# Patient Record
Sex: Male | Born: 1980
Health system: Southern US, Community
[De-identification: ages and names within clinical notes are randomized; demographics above are authoritative.]

---
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
## 2005-09-27 ENCOUNTER — Emergency Department (HOSPITAL_COMMUNITY): Admission: EM | Admit: 2005-09-27 | Discharge: 2005-09-27 | Payer: Self-pay | Admitting: Family Medicine

## 2006-06-13 ENCOUNTER — Emergency Department (HOSPITAL_COMMUNITY): Admission: EM | Admit: 2006-06-13 | Discharge: 2006-06-13 | Payer: Self-pay | Admitting: Emergency Medicine

## 2006-08-06 ENCOUNTER — Emergency Department (HOSPITAL_COMMUNITY): Admission: EM | Admit: 2006-08-06 | Discharge: 2006-08-06 | Payer: Self-pay | Admitting: Family Medicine

## 2010-01-02 ENCOUNTER — Emergency Department (HOSPITAL_BASED_OUTPATIENT_CLINIC_OR_DEPARTMENT_OTHER): Admission: EM | Admit: 2010-01-02 | Discharge: 2010-01-03 | Payer: Self-pay | Admitting: Emergency Medicine

## 2010-03-28 ENCOUNTER — Ambulatory Visit: Payer: Self-pay | Admitting: Radiology

## 2010-03-28 ENCOUNTER — Emergency Department (HOSPITAL_BASED_OUTPATIENT_CLINIC_OR_DEPARTMENT_OTHER): Admission: EM | Admit: 2010-03-28 | Discharge: 2010-03-28 | Payer: Self-pay | Admitting: Emergency Medicine

## 2010-04-01 ENCOUNTER — Emergency Department (HOSPITAL_BASED_OUTPATIENT_CLINIC_OR_DEPARTMENT_OTHER): Admission: EM | Admit: 2010-04-01 | Discharge: 2010-04-01 | Payer: Self-pay | Admitting: Emergency Medicine

## 2010-04-01 ENCOUNTER — Ambulatory Visit: Payer: Self-pay | Admitting: Diagnostic Radiology

## 2011-02-19 ENCOUNTER — Emergency Department (HOSPITAL_BASED_OUTPATIENT_CLINIC_OR_DEPARTMENT_OTHER)
Admission: EM | Admit: 2011-02-19 | Discharge: 2011-02-19 | Disposition: A | Discharge status: Home / Self Care | Payer: Self-pay | Attending: Emergency Medicine | Admitting: Emergency Medicine

## 2011-02-19 DIAGNOSIS — L255 Unspecified contact dermatitis due to plants, except food: Secondary | ICD-10-CM | POA: Insufficient documentation

## 2011-02-19 DIAGNOSIS — T622X1A Toxic effect of other ingested (parts of) plant(s), accidental (unintentional), initial encounter: Secondary | ICD-10-CM | POA: Insufficient documentation

## 2012-04-09 ENCOUNTER — Encounter (HOSPITAL_BASED_OUTPATIENT_CLINIC_OR_DEPARTMENT_OTHER): Payer: Self-pay | Admitting: *Deleted

## 2012-04-09 ENCOUNTER — Emergency Department (HOSPITAL_BASED_OUTPATIENT_CLINIC_OR_DEPARTMENT_OTHER)
Admission: EM | Admit: 2012-04-09 | Discharge: 2012-04-10 | Disposition: A | Discharge status: Home / Self Care | Payer: Self-pay | Attending: Emergency Medicine | Admitting: Emergency Medicine

## 2012-04-09 DIAGNOSIS — R509 Fever, unspecified: Secondary | ICD-10-CM | POA: Insufficient documentation

## 2012-04-09 MED ORDER — ACETAMINOPHEN 500 MG PO TABS
1000.0000 mg | ORAL_TABLET | Freq: Once | ORAL | Status: AC
  Administered 2012-04-09: 1000 mg via ORAL
  Filled 2012-04-09: qty 2

## 2012-04-09 NOTE — ED Notes (Signed)
Pt reports chills, body aches x43min. Pt anxious upon arrival to ER. Coached on relaxation techniques.

## 2012-04-10 ENCOUNTER — Emergency Department (HOSPITAL_BASED_OUTPATIENT_CLINIC_OR_DEPARTMENT_OTHER): Payer: Self-pay

## 2012-04-10 LAB — BASIC METABOLIC PANEL
Calcium: 9.4 mg/dL (ref 8.4–10.5)
Chloride: 100 mEq/L (ref 96–112)
Creatinine, Ser: 1 mg/dL (ref 0.50–1.35)
GFR calc Af Amer: >90 mL/min (ref >90)
Glucose, Bld: 106 mg/dL — ABNORMAL HIGH (ref 70–99)
Potassium: 3.7 mEq/L (ref 3.5–5.1)
Sodium: 137 mEq/L (ref 135–145)

## 2012-04-10 LAB — CBC WITH DIFFERENTIAL/PLATELET
Basophils Absolute: 0 10*3/uL (ref 0.0–0.1)
Eosinophils Absolute: 0.1 10*3/uL (ref 0.0–0.7)
Lymphocytes Relative: 10 % — ABNORMAL LOW (ref 12–46)
Lymphs Abs: 1 10*3/uL (ref 0.7–4.0)
MCH: 31 pg (ref 26.0–34.0)
MCV: 87.1 fL (ref 78.0–100.0)
Monocytes Absolute: 0.8 10*3/uL (ref 0.1–1.0)
Monocytes Relative: 7 % (ref 3–12)
Neutrophils Relative %: 83 % — ABNORMAL HIGH (ref 43–77)
RBC: 4.58 MIL/uL (ref 4.22–5.81)
WBC: 10.9 10*3/uL — ABNORMAL HIGH (ref 4.0–10.5)

## 2012-04-10 LAB — URINALYSIS, ROUTINE W REFLEX MICROSCOPIC
Hgb urine dipstick: NEGATIVE
Ketones, ur: NEGATIVE mg/dL
Leukocytes, UA: NEGATIVE
Nitrite: NEGATIVE
pH: 7.5 (ref 5.0–8.0)

## 2012-04-10 NOTE — ED Notes (Signed)
Pt reports eating at [employer] earlier in the evening, developed sudden onset of chills, malaise, denies, n/v, abdominal pain, sob or chest pain.

## 2012-04-10 NOTE — ED Provider Notes (Signed)
History     CSN: [id number]  Arrival date & time 04/09/12  2140   First MD Initiated Contact with Patient 04/10/12 0111      Chief Complaint  Patient presents with  . Chills    (Consider location/radiation/quality/duration/timing/severity/associated sxs/prior treatment) HPI Pt reports he was in his normal state of health at work just prior to arrival when he had a brief episode of L side pain followed by feeling cold and having shakes all over. He drove himself to the ED where he was found to have fever. He denies any URI symptoms, cough, chest pain, SOB, nausea vomiting, diarrhea, dysuria. He no longer has flank pain. He has a mild headache but no neck stiffness. No rashes or tick bites. No known sick contacts.   History reviewed. No pertinent past medical history.  History reviewed. No pertinent past surgical history.  No family history on file.  History  Substance Use Topics  . Smoking status: Never Smoker   . Smokeless tobacco: Not on file  . Alcohol Use: No      Review of Systems All other systems reviewed and are negative except as noted in HPI.   Allergies  Review of patient's allergies indicates no known allergies.  Home Medications   Current Outpatient Rx  Name Route Sig Dispense Refill  . HYDROCORTISONE 2.5 % EX CREA Topical Apply 1 application topically daily.    . ADULT MULTIVITAMIN W/MINERALS CH Oral Take 1 tablet by mouth daily.      BP 122/69  Pulse 120  Temp 99.5 F (37.5 C) (Oral)  Resp 22  Ht 6\' 1"  (1.854 m)  Wt 140 lb (63.504 kg)  BMI 18.47 kg/m2  SpO2 97%  Physical Exam  Nursing note and vitals reviewed. Constitutional: He is oriented to person, place, and time. He appears well-developed and well-nourished.  HENT:  Head: Normocephalic and atraumatic.  Eyes: EOM are normal. Pupils are equal, round, and reactive to light.  Neck: Normal range of motion. Neck supple.       No meningismus  Cardiovascular: Normal rate, normal heart sounds  and intact distal pulses.   Pulmonary/Chest: Effort normal and breath sounds normal.  Abdominal: Bowel sounds are normal. He exhibits no distension. There is no tenderness.  Musculoskeletal: Normal range of motion. He exhibits no edema and no tenderness.  Neurological: He is alert and oriented to person, place, and time. He has normal strength. No cranial nerve deficit or sensory deficit.  Skin: Skin is warm and dry. No rash noted.  Psychiatric: He has a normal mood and affect.    ED Course  Procedures (including critical care time)  Labs Reviewed  CBC WITH DIFFERENTIAL - Abnormal; Notable for the following:    WBC 10.9 (*)     Neutrophils Relative 83 (*)     Neutro Abs 9.0 (*)     Lymphocytes Relative 10 (*)     All other components within normal limits  BASIC METABOLIC PANEL - Abnormal; Notable for the following:    Glucose, Bld 106 (*)     All other components within normal limits  URINALYSIS, ROUTINE W REFLEX MICROSCOPIC   Dg Chest 2 View  04/10/2012  *RADIOLOGY REPORT*  Clinical Data: Sudden onset of fever, chills, body aches and headache.  CHEST - 2 VIEW  Comparison: None.  Findings: The lungs are well-aerated and clear.  There is no evidence of focal opacification, pleural effusion or pneumothorax.  The heart is normal in size; the mediastinal contour  is within normal limits.  No acute osseous abnormalities are seen.  IMPRESSION: No acute cardiopulmonary process seen.  Original Report Authenticated By: Tonia Ghent, M.D.     No diagnosis found.    MDM  Pt febrile but improved with APAP. No source for infection found in the ED. Discussed HIV testing with patient, states he was tested about a year ago but consents to HIV antibody screen tonight. Advised he would be contacted if it was positive. He is feeling better and ready to go home.         Ilana Prezioso B. Bernette Mayers, MD 04/10/12 [phone]

## 2013-12-24 DIAGNOSIS — R51 Headache: Secondary | ICD-10-CM | POA: Insufficient documentation

## 2013-12-24 DIAGNOSIS — R1013 Epigastric pain: Secondary | ICD-10-CM | POA: Insufficient documentation

## 2013-12-24 DIAGNOSIS — M549 Dorsalgia, unspecified: Secondary | ICD-10-CM | POA: Insufficient documentation

## 2013-12-24 DIAGNOSIS — IMO0002 Reserved for concepts with insufficient information to code with codable children: Secondary | ICD-10-CM | POA: Insufficient documentation

## 2013-12-25 ENCOUNTER — Emergency Department (HOSPITAL_BASED_OUTPATIENT_CLINIC_OR_DEPARTMENT_OTHER)
Admission: EM | Admit: 2013-12-25 | Discharge: 2013-12-25 | Disposition: A | Discharge status: Home / Self Care | Payer: Self-pay | Attending: Emergency Medicine | Admitting: Emergency Medicine

## 2013-12-25 ENCOUNTER — Encounter (HOSPITAL_BASED_OUTPATIENT_CLINIC_OR_DEPARTMENT_OTHER): Payer: Self-pay | Admitting: Emergency Medicine

## 2013-12-25 DIAGNOSIS — R109 Unspecified abdominal pain: Secondary | ICD-10-CM

## 2013-12-25 DIAGNOSIS — M549 Dorsalgia, unspecified: Secondary | ICD-10-CM

## 2013-12-25 LAB — CBC WITH DIFFERENTIAL/PLATELET
BASOS ABS: 0 10*3/uL (ref 0.0–0.1)
BASOS PCT: 0 % (ref 0–1)
EOS PCT: 1 % (ref 0–5)
Eosinophils Absolute: 0.1 10*3/uL (ref 0.0–0.7)
HEMATOCRIT: 42.9 % (ref 39.0–52.0)
Hemoglobin: 15.3 g/dL (ref 13.0–17.0)
Lymphocytes Relative: 26 % (ref 12–46)
Lymphs Abs: 1.9 10*3/uL (ref 0.7–4.0)
MCH: 31.6 pg (ref 26.0–34.0)
MCHC: 35.7 g/dL (ref 30.0–36.0)
MCV: 88.6 fL (ref 78.0–100.0)
MONO ABS: 0.7 10*3/uL (ref 0.1–1.0)
MONOS PCT: 10 % (ref 3–12)
NEUTROS PCT: 62 % (ref 43–77)
Neutro Abs: 4.4 10*3/uL (ref 1.7–7.7)
Platelets: 191 10*3/uL (ref 150–400)
RBC: 4.84 MIL/uL (ref 4.22–5.81)
RDW: 12.4 % (ref 11.5–15.5)
WBC: 7.1 10*3/uL (ref 4.0–10.5)

## 2013-12-25 LAB — COMPREHENSIVE METABOLIC PANEL
ALBUMIN: 4.3 g/dL (ref 3.5–5.2)
ALT: 15 U/L (ref 0–53)
AST: 18 U/L (ref 0–37)
Alkaline Phosphatase: 66 U/L (ref 39–117)
BUN: 16 mg/dL (ref 6–23)
CALCIUM: 9.7 mg/dL (ref 8.4–10.5)
CO2: 31 meq/L (ref 19–32)
CREATININE: 1.1 mg/dL (ref 0.50–1.35)
Chloride: 99 mEq/L (ref 96–112)
GFR, EST NON AFRICAN AMERICAN: 88 mL/min — AB (ref >90)
Glucose, Bld: 93 mg/dL (ref 70–99)
POTASSIUM: 3.8 meq/L (ref 3.7–5.3)
Sodium: 140 mEq/L (ref 137–147)
Total Bilirubin: 0.7 mg/dL (ref 0.3–1.2)
Total Protein: 7.5 g/dL (ref 6.0–8.3)

## 2013-12-25 LAB — URINALYSIS, ROUTINE W REFLEX MICROSCOPIC
Bilirubin Urine: NEGATIVE
GLUCOSE, UA: NEGATIVE mg/dL
HGB URINE DIPSTICK: NEGATIVE
Ketones, ur: 15 mg/dL — AB
Leukocytes, UA: NEGATIVE
NITRITE: NEGATIVE
PH: 5 (ref 5.0–8.0)
PROTEIN: NEGATIVE mg/dL
SPECIFIC GRAVITY, URINE: 1.031 — AB (ref 1.005–1.030)
Urobilinogen, UA: 1 mg/dL (ref 0.0–1.0)

## 2013-12-25 LAB — LIPASE, BLOOD: LIPASE: 18 U/L (ref 11–59)

## 2013-12-25 MED ORDER — IBUPROFEN 600 MG PO TABS: 600.0000 mg | ORAL_TABLET | Freq: Four times a day (QID) | ORAL | Status: DC | PRN

## 2013-12-25 NOTE — ED Provider Notes (Signed)
CSN: [id number]     Arrival date & time 12/24/13  2335 History   First MD Initiated Contact with Patient 12/25/13 [phone]     Chief Complaint  Patient presents with  . Abdominal Pain  . Back Pain     (Consider location/radiation/quality/duration/timing/severity/associated sxs/prior Treatment) HPI Comments: Pt comes in with cc of headaches, abd pain and back pain. All hi [place] started on Wednesday. His abd pain is epigastric and right sided, constant, with no specific aggravating or relieving factors. Pain is moving towards the back, with no uti like sx. No nausea, emesis. He is also having a mild headache. No urti like sx, no hx of renal stones, no abd surgery.  Patient is a 33 y.o. male presenting with abdominal pain and back pain. The history is provided by the patient.  Abdominal Pain Associated symptoms: no chest pain, no cough, no dysuria and no shortness of breath   Back Pain Associated symptoms: abdominal pain and headaches   Associated symptoms: no chest pain and no dysuria     History reviewed. No pertinent past medical history. History reviewed. No pertinent past surgical history. No family history on file. History  Substance Use Topics  . Smoking status: Never Smoker   . Smokeless tobacco: Not on file  . Alcohol Use: No    Review of Systems  Constitutional: Negative for activity change and appetite change.  Respiratory: Negative for cough and shortness of breath.   Cardiovascular: Negative for chest pain.  Gastrointestinal: Positive for abdominal pain.  Genitourinary: Positive for flank pain. Negative for dysuria, frequency, scrotal swelling and testicular pain.  Musculoskeletal: Positive for back pain.  Neurological: Positive for headaches.      Allergies  Review of patient's allergies indicates no known allergies.  Home Medications   Current Outpatient Rx  Name  Route  Sig  Dispense  Refill  . hydrocortisone 2.5 % cream   Topical   Apply 1 application  topically daily.         Marland Kitchen. ibuprofen (ADVIL,MOTRIN) 600 MG tablet   Oral   Take 1 tablet (600 mg total) by mouth every 6 (six) hours as needed.   30 tablet   0   . Multiple Vitamin (MULTIVITAMIN WITH MINERALS) TABS   Oral   Take 1 tablet by mouth daily.          BP 116/75  Pulse 85  Temp(Src) 98 F (36.7 C) (Oral)  Resp 16  Ht 6' (1.829 m)  Wt 145 lb (65.772 kg)  BMI 19.66 kg/m2  SpO2 100% Physical Exam  Nursing note and vitals reviewed. Constitutional: He is oriented to person, place, and time. He appears well-developed.  HENT:  Head: Normocephalic and atraumatic.  Eyes: Conjunctivae and EOM are normal. Pupils are equal, round, and reactive to light.  Neck: Normal range of motion. Neck supple.  Cardiovascular: Normal rate and regular rhythm.   Pulmonary/Chest: Effort normal and breath sounds normal.  Abdominal: Soft. Bowel sounds are normal. He exhibits no distension. There is no tenderness. There is no rebound and no guarding.  Neurological: He is alert and oriented to person, place, and time.  Skin: Skin is warm.    ED Course  Procedures (including critical care time) Labs Review Labs Reviewed  URINALYSIS, ROUTINE W REFLEX MICROSCOPIC - Abnormal; Notable for the following:    Specific Gravity, Urine 1.031 (*)    Ketones, ur 15 (*)    All other components within normal limits  COMPREHENSIVE METABOLIC PANEL -  Abnormal; Notable for the following:    GFR calc non Af Amer 88 (*)    All other components within normal limits  CBC WITH DIFFERENTIAL  LIPASE, BLOOD   Imaging Review No results found.   EKG Interpretation None      MDM   Final diagnoses:  Abdominal pain  Back pain    Pt comes in with cc of abd pain, headache, back pain. Back pain is not over the spine. Abd exam x 2  -unchanged, no peritoneal signs, and the tenderness is periumbilical. GI labs are WNL.  Return precautions for early appy discussed wit the patient, but suspect that this is  possible mild gastro.    Derwood Kaplan, MD 12/25/13 [phone]

## 2013-12-25 NOTE — Discharge Instructions (Signed)
We saw you in the ER for the abdominal pain. All of our results are normal. We are not sure what is causing your abdominal pain, and recommend that you see your primary care doctor for further evaluation. If your symptoms get worse, return to the ER. Take the pain meds and nausea meds as prescribed.  RESOURCE GUIDE  Chronic Pain Problems: Contact [facility]  [phone] Patients need to be referred by their primary care doctor.  Insufficient Money for Medicine: Contact [employer]:  call "211."   No Primary Care Doctor: - Call Health Connect  [phone] - can help you locate a primary care doctor that  accepts your insurance, provides certain services, etc. - Physician Referral Service- [phone]  Agencies that provide inexpensive medical care: - [facility]  [phone] - [facility]  [phone] - [facility]  [phone] - Women's Clinic  [phone] - [facility]  [phone] [facility]  [phone]  Medicaid-accepting [facility] Providers: - [facility]- [address]  [phone], Mon-Fri 9am-7pm, Sat 9am-1pm - [facility]- [address] [place], [address] [place]  [phone] - [facility]- [address] [place]  [phone] [facility]- [address]  [phone] - Renaye Rakers- [address] [place], [address], [phone]  Only accepts [place] patients after they have their name  applied to their card  Self Pay (no insurance) in [place]: - Sickle Cell Patients: Dr Willey Blade, [facility]  [address] [place], [phone] - [facility]- [address] [place]  [phone]       [facility] [place]- [address]       -     [facility]- see information above (Speak to [employer] if you do not have insurance)       -  [facility]- [address],  [phone]       -  [facility]- [address] [phone]       -  Dr Julio Sicks-  [address], [place], [phone]       -  [facility] - [address], [phone]       -  [facility]- [address], [phone], also [address] [phone]       -    [facility]- [address] [place], [phone], 1st & 3rd Saturday        every month, 10am-1pm  [facility] [address] [place], [place] [address] [phone]  [facility] [address] [place], [place] [address] [phone]  1) Find a Doctor and Pay Out of Pocket Although you won't have to find out who is covered by your insurance plan, it is a good idea to ask around and get recommendations. You will then need to call the office and see if the doctor you have chosen will accept you as a new patient and what types of options they offer for patients who are self-pay. Some doctors offer discounts or will set up payment plans for their patients who do not have insurance, but you will need to ask so you aren't surprised when you get to your appointment.  2) Contact Your Local Health Department Not all health departments have doctors that can see patients for sick visits, but many do, so it is worth a call to see if yours does. If you don't know where your local health department is, you can check in your phone book. The CDC also has a tool to help you locate your state's health department, and many state websites also have listings of all of their local health departments.  3) Find a Walk-in Clinic If your illness is not likely to be very severe or complicated, you may want to try a walk in clinic. These are popping up all over the country in pharmacies, drugstores, and shopping centers. They're usually staffed by nurse practitioners or physician assistants that have been trained to treat  common illnesses and complaints. They're usually fairly quick and inexpensive. However, if you have serious medical issues or chronic medical problems, these are probably not your best option  STD Testing - [facility] [place], STD Clinic, [address], [place], phone [phone] or [phone].  Monday - Friday, call for an appointment. [facility]- [place] [facility] [place], STD Clinic, [place], [place], phone [phone] or [phone].  Monday - Friday, call for an appointment.  Abuse/Neglect: [facility]- [place] Child Abuse Hotline [phone] [facility]- [place] Child Abuse Hotline 818-858-12374063769630 (After Hours)  Emergency Shelter:  [facility] [phone]  Maternity Homes: - [facility] [phone] - [facility] [phone]  MRSA Hotline #:   (605) 061-9448(901)639-4511  Dental Assistance If unable to pay or uninsured, contact:  [facility] to become qualified for the adult dental clinic.  Patients with Medicaid: [facility] [phone], [phone] [address], [phone]  If unable to pay, or uninsured, contact [facility] [phone] in [place], [phone] in [facility]) to become qualified for the adult dental clinic  [facility] [address] [place], [place] [address] [phone] [email or web address]  Other [facility] Services: - [facility]- [address], [place], [place], [phone], [phone], [address]. [phone], 2nd and 4th Thursday of the month at 6:30am.  10 clients each day by appointment, can sometimes see walk-in patients if someone does not show for an appointment. [facility]- [address], [place], [place], [phone], [phone] - [facility]- [address], [place], [place], [phone], [phone] [facility]- [place] [facility]  Department- [phone] [facility]- [place] [facility]- [phone] [facility]- [place] [phone]- [phone]          Abdominal Pain, Adult Many things can cause abdominal pain. Usually, abdominal pain is not caused by a disease and will improve without treatment. It can often be observed and treated at home. Your health care provider will do a physical exam and possibly order blood tests and X-rays to help determine the seriousness of your pain. However, in many cases, more time must pass before a clear cause of the pain can be found. Before that point, your health care provider may not know if you need more testing or further treatment. HOME CARE INSTRUCTIONS  Monitor your abdominal pain for any changes. The following actions may help to alleviate any discomfort you are experiencing:  Only take over-the-counter or prescription medicines as directed by your health care provider.  Do not take laxatives unless directed to do so by your  health care provider.  Try a clear liquid diet (broth, tea, or water) as directed by your health care provider. Slowly move to a bland diet as tolerated. SEEK MEDICAL CARE IF:  You have unexplained abdominal pain.  You have abdominal pain associated with nausea or diarrhea.  You have pain when you urinate or have a bowel movement.  You experience abdominal pain that wakes you in the night.  You have abdominal pain that is worsened or improved by eating food.  You have abdominal pain that is worsened with eating fatty foods. SEEK IMMEDIATE MEDICAL CARE IF:   Your pain does not go away within 2 hours.  You have a fever.  You keep throwing up (vomiting).  Your pain is felt only in portions of the abdomen, such as the right side or the left lower portion of the abdomen.  You pass bloody or black tarry stools. MAKE SURE YOU:  Understand these instructions.   Will watch your condition.   Will get help right away if you are not doing well or  get worse.  Document Released: 06/26/2005 Document Revised: 07/07/2013 Document Reviewed: 05/26/2013 [facility] Patient Information 2014 [place], [place].

## 2013-12-25 NOTE — ED Notes (Signed)
Pt c/o abd pain and ha onset 2 days ago w pain now radiating to rt flank  Denies urinary diff

## 2013-12-25 NOTE — ED Notes (Signed)
Pt. Reports abd. Pain lower abd.  And reports R side back pain also.  NO nausea, vomiting or diarrhea.  Pt. Reports pain 2/10.

## 2014-09-26 ENCOUNTER — Encounter (HOSPITAL_BASED_OUTPATIENT_CLINIC_OR_DEPARTMENT_OTHER): Payer: Self-pay

## 2014-09-26 ENCOUNTER — Emergency Department (HOSPITAL_BASED_OUTPATIENT_CLINIC_OR_DEPARTMENT_OTHER): Payer: Self-pay

## 2014-09-26 ENCOUNTER — Emergency Department (HOSPITAL_BASED_OUTPATIENT_CLINIC_OR_DEPARTMENT_OTHER)
Admission: EM | Admit: 2014-09-26 | Discharge: 2014-09-26 | Disposition: A | Discharge status: Home / Self Care | Payer: Self-pay | Attending: Emergency Medicine | Admitting: Emergency Medicine

## 2014-09-26 DIAGNOSIS — J40 Bronchitis, not specified as acute or chronic: Secondary | ICD-10-CM

## 2014-09-26 DIAGNOSIS — Z791 Long term (current) use of non-steroidal anti-inflammatories (NSAID): Secondary | ICD-10-CM | POA: Insufficient documentation

## 2014-09-26 DIAGNOSIS — R51 Headache: Secondary | ICD-10-CM | POA: Insufficient documentation

## 2014-09-26 DIAGNOSIS — R059 Cough, unspecified: Secondary | ICD-10-CM

## 2014-09-26 DIAGNOSIS — Z7952 Long term (current) use of systemic steroids: Secondary | ICD-10-CM | POA: Insufficient documentation

## 2014-09-26 DIAGNOSIS — R05 Cough: Secondary | ICD-10-CM

## 2014-09-26 DIAGNOSIS — J209 Acute bronchitis, unspecified: Secondary | ICD-10-CM | POA: Insufficient documentation

## 2014-09-26 LAB — RAPID STREP SCREEN (MED CTR MEBANE ONLY): Streptococcus, Group A Screen (Direct): NEGATIVE

## 2014-09-26 MED ORDER — AZITHROMYCIN 250 MG PO TABS
500.0000 mg | ORAL_TABLET | Freq: Once | ORAL | Status: AC
Start: 2014-09-26 — End: 2014-09-26
  Administered 2014-09-26: 500 mg via ORAL
  Filled 2014-09-26: qty 2

## 2014-09-26 MED ORDER — HYDROCODONE-ACETAMINOPHEN 5-325 MG PO TABS: 2.0000 | ORAL_TABLET | ORAL | Status: DC | PRN

## 2014-09-26 MED ORDER — AZITHROMYCIN 250 MG PO TABS: ORAL_TABLET | ORAL | Status: DC

## 2014-09-26 NOTE — ED Provider Notes (Signed)
CSN: [id number]     Arrival date & time 09/26/14  1706 History  This chart was scribed for Nelia Shiobert L Gustaf Mccarter, MD by Abel PrestoKara Demonbreun, ED Scribe. This patient was seen in room MH01/MH01 and the patient's care was started at 6:31 PM.      Chief Complaint  Patient presents with  . Sore Throat    Patient is a 33 y.o. male presenting with pharyngitis. The history is provided by the patient. No language interpreter was used.  Sore Throat Associated symptoms include headaches.    HPI Comments: Mike BoydenDomaneick Shutes is a 33 y.o. male who presents to the Emergency Department complaining of sore throat with onset 2 days ago.  He notes associated fever, chills, productive cough with yellow sputum, and a headache. Pt notes associated pain with swallowing which has resolved. Pt has taken Mucinex for relief but no Tylenol. Pt is not a smoker.  Pt notes he has been in contact with his daughter who recently had a cold. Pt denies any other medical problems.   History reviewed. No pertinent past medical history. History reviewed. No pertinent past surgical history. No family history on file. History  Substance Use Topics  . Smoking status: Never Smoker   . Smokeless tobacco: Not on file  . Alcohol Use: No    Review of Systems  Constitutional: Positive for fever and chills.  HENT: Positive for congestion, sore throat and trouble swallowing (resolved).   Respiratory: Positive for cough.   Neurological: Positive for headaches.  All other systems reviewed and are negative.     Allergies  Review of patient's allergies indicates no known allergies.  Home Medications   Prior to Admission medications   Medication Sig Start Date End Date Taking? Authorizing Provider  hydrocortisone 2.5 % cream Apply 1 application topically daily.    Historical Provider, MD  ibuprofen (ADVIL,MOTRIN) 600 MG tablet Take 1 tablet (600 mg total) by mouth every 6 (six) hours as needed. 12/25/13   Derwood KaplanAnkit Nanavati, MD  Multiple  Vitamin (MULTIVITAMIN WITH MINERALS) TABS Take 1 tablet by mouth daily.    Historical Provider, MD   BP 116/68 mmHg  Pulse 123  Temp(Src) 100.8 F (38.2 C) (Oral)  Resp 16  Ht 6\' 1"  (1.854 m)  Wt 145 lb (65.772 kg)  BMI 19.13 kg/m2  SpO2 100% Physical Exam Physical Exam  Nursing note and vitals reviewed. Constitutional: He is oriented to person, place, and time. He appears well-developed and well-nourished. No distress.  HENT:  Head: Normocephalic and atraumatic.  Eyes: Pupils are equal, round, and reactive to light.  Neck: Normal range of motion.  Cardiovascular: Normal rate and intact distal pulses.   Pulmonary/Chest: No respiratory distress.  Abdominal: Normal appearance. He exhibits no distension.  Musculoskeletal: Normal range of motion.  Neurological: He is alert and oriented to person, place, and time. No cranial nerve deficit.  Skin: Skin is warm and dry. No rash noted.  Psychiatric: He has a normal mood and affect. His behavior is normal.   ED Course  Procedures (including critical care time) Medications  azithromycin (ZITHROMAX) tablet 500 mg (500 mg Oral Given 09/26/14 1858)    DIAGNOSTIC STUDIES: Oxygen Saturation is 100% on room air, normal by my interpretation.    COORDINATION OF CARE: 6:36 PM Discussed treatment plan with patient at beside, the patient agrees with the plan and has no further questions at this time.   Labs Review Labs Reviewed  RAPID STREP SCREEN  CULTURE, GROUP A STREP  Imaging Review Dg Chest 2 View  09/26/2014   CLINICAL DATA:  Cough.  Chills.  Malaise.  EXAM: CHEST  2 VIEW  COMPARISON:  04/10/2012  FINDINGS: The heart size and mediastinal contours are within normal limits. Both lungs are clear. The visualized skeletal structures are unremarkable.  IMPRESSION: No active cardiopulmonary disease.   Electronically Signed   By: Myles RosenthalJohn  Stahl M.D.   On: 09/26/2014 18:33      MDM   Final diagnoses:  Cough   bronchitis    I  personally performed the services described in this documentation, which was scribed in my presence. The recorded information has been reviewed and considered.    Nelia Shiobert L Shanta Hartner, MD 09/26/14 [phone]

## 2014-09-26 NOTE — Discharge Instructions (Signed)
Fever, Adult °A fever is a higher than normal body temperature. In an adult, an oral temperature around 98.6° F (37° C) is considered normal. A temperature of 100.4° F (38° C) or higher is generally considered a fever. Mild or moderate fevers generally have no long-term effects and often do not require treatment. Extreme fever (greater than or equal to 106° F or 41.1° C) can cause seizures. The sweating that may occur with repeated or prolonged fever may cause dehydration. Elderly people can develop confusion during a fever. °A measured temperature can vary with: °· Age. °· Time of day. °· Method of measurement (mouth, underarm, rectal, or ear). °The fever is confirmed by taking a temperature with a thermometer. Temperatures can be taken different ways. Some methods are accurate and some are not. °· An oral temperature is used most commonly. Electronic thermometers are fast and accurate. °· An ear temperature will only be accurate if the thermometer is positioned as recommended by the manufacturer. °· A rectal temperature is accurate and done for those adults who have a condition where an oral temperature cannot be taken. °· An underarm (axillary) temperature is not accurate and not recommended. °Fever is a symptom, not a disease.  °CAUSES  °· Infections commonly cause fever. °· Some noninfectious causes for fever include: °¨ Some arthritis conditions. °¨ Some thyroid or adrenal gland conditions. °¨ Some immune system conditions. °¨ Some types of cancer. °¨ A medicine reaction. °¨ High doses of certain street drugs such as methamphetamine. °¨ Dehydration. °¨ Exposure to high outside or room temperatures. °· Occasionally, the source of a fever cannot be determined. This is sometimes called a "fever of unknown origin" (FUO). °· Some situations may lead to a temporary rise in body temperature that may go away on its own. Examples are: °¨ Childbirth. °¨ Surgery. °¨ Intense exercise. °HOME CARE INSTRUCTIONS  °· Take  appropriate medicines for fever. Follow dosing instructions carefully. If you use acetaminophen to reduce the fever, be careful to avoid taking other medicines that also contain acetaminophen. Do not take aspirin for a fever if you are younger than age 19. There is an association with Reye's syndrome. Reye's syndrome is a rare but potentially deadly disease. °· If an infection is present and antibiotics have been prescribed, take them as directed. Finish them even if you start to feel better. °· Rest as needed. °· Maintain an adequate fluid intake. To prevent dehydration during an illness with prolonged or recurrent fever, you may need to drink extra fluid. Drink enough fluids to keep your urine clear or pale yellow. °· Sponging or bathing with room temperature water may help reduce body temperature. Do not use ice water or alcohol sponge baths. °· Dress comfortably, but do not over-bundle. °SEEK MEDICAL CARE IF:  °· You are unable to keep fluids down. °· You develop vomiting or diarrhea. °· You are not feeling at least partly better after 3 days. °· You develop new symptoms or problems. °SEEK IMMEDIATE MEDICAL CARE IF:  °· You have shortness of breath or trouble breathing. °· You develop excessive weakness. °· You are dizzy or you faint. °· You are extremely thirsty or you are making little or no urine. °· You develop new pain that was not there before (such as in the head, neck, chest, back, or abdomen). °· You have persistent vomiting and diarrhea for more than 1 to 2 days. °· You develop a stiff neck or your eyes become sensitive to light. °· You develop a   skin rash. °· You have a fever or persistent symptoms for more than 2 to 3 days. °· You have a fever and your symptoms suddenly get worse. °MAKE SURE YOU:  °· Understand these instructions. °· Will watch your condition. °· Will get help right away if you are not doing well or get worse. °Document Released: 03/12/2001 Document Revised: 01/31/2014 Document  Reviewed: 07/18/2011 °ExitCare® Patient Information ©2015 [employer]. This information is not intended to replace advice given to you by your health care provider. Make sure you discuss any questions you have with your health care provider. °Acute Bronchitis °Bronchitis is inflammation of the airways that extend from the windpipe into the lungs (bronchi). The inflammation often causes mucus to develop. This leads to a cough, which is the most common symptom of bronchitis.  °In acute bronchitis, the condition usually develops suddenly and goes away over time, usually in a couple weeks. Smoking, allergies, and asthma can make bronchitis worse. Repeated episodes of bronchitis may cause further lung problems.  °CAUSES °Acute bronchitis is most often caused by the same virus that causes a cold. The virus can spread from person to person (contagious) through coughing, sneezing, and touching contaminated objects. °SIGNS AND SYMPTOMS  °· Cough.   °· Fever.   °· Coughing up mucus.   °· Body aches.   °· Chest congestion.   °· Chills.   °· Shortness of breath.   °· Sore throat.   °DIAGNOSIS  °Acute bronchitis is usually diagnosed through a physical exam. Your health care provider will also ask you questions about your medical history. Tests, such as chest X-rays, are sometimes done to rule out other conditions.  °TREATMENT  °Acute bronchitis usually goes away in a couple weeks. Oftentimes, no medical treatment is necessary. Medicines are sometimes given for relief of fever or cough. Antibiotic medicines are usually not needed but may be prescribed in certain situations. In some cases, an inhaler may be recommended to help reduce shortness of breath and control the cough. A cool mist vaporizer may also be used to help thin bronchial secretions and make it easier to clear the chest.  °HOME CARE INSTRUCTIONS °· Get plenty of rest.   °· Drink enough fluids to keep your urine clear or pale yellow (unless you have a medical  condition that requires fluid restriction). Increasing fluids may help thin your respiratory secretions (sputum) and reduce chest congestion, and it will prevent dehydration.   °· Take medicines only as directed by your health care provider. °· If you were prescribed an antibiotic medicine, finish it all even if you start to feel better. °· Avoid smoking and secondhand smoke. Exposure to cigarette smoke or irritating chemicals will make bronchitis worse. If you are a smoker, consider using nicotine gum or skin patches to help control withdrawal symptoms. Quitting smoking will help your lungs heal faster.   °· Reduce the chances of another bout of acute bronchitis by washing your hands frequently, avoiding people with cold symptoms, and trying not to touch your hands to your mouth, nose, or eyes.   °· Keep all follow-up visits as directed by your health care provider.   °SEEK MEDICAL CARE IF: °Your symptoms do not improve after 1 week of treatment.  °SEEK IMMEDIATE MEDICAL CARE IF: °· You develop an increased fever or chills.   °· You have chest pain.   °· You have severe shortness of breath. °· You have bloody sputum.   °· You develop dehydration. °· You faint or repeatedly feel like you are going to pass out. °· You develop repeated vomiting. °·   You develop a severe headache. °MAKE SURE YOU:  °· Understand these instructions. °· Will watch your condition. °· Will get help right away if you are not doing well or get worse. °Document Released: 10/24/2004 Document Revised: 01/31/2014 Document Reviewed: 03/09/2013 °ExitCare® Patient Information ©2015 [employer]. This information is not intended to replace advice given to you by your health care provider. Make sure you discuss any questions you have with your health care provider. ° °

## 2014-09-26 NOTE — ED Notes (Signed)
Pt reports today started having sore throat, headache, body aches and chills.  Pt report coughing up yellow sputum.

## 2014-09-28 LAB — CULTURE, GROUP A STREP

## 2015-07-08 ENCOUNTER — Emergency Department (HOSPITAL_BASED_OUTPATIENT_CLINIC_OR_DEPARTMENT_OTHER): Payer: Self-pay

## 2015-07-08 ENCOUNTER — Emergency Department (HOSPITAL_BASED_OUTPATIENT_CLINIC_OR_DEPARTMENT_OTHER)
Admission: EM | Admit: 2015-07-08 | Discharge: 2015-07-08 | Disposition: A | Discharge status: Home / Self Care | Payer: Self-pay | Attending: Emergency Medicine | Admitting: Emergency Medicine

## 2015-07-08 ENCOUNTER — Encounter (HOSPITAL_BASED_OUTPATIENT_CLINIC_OR_DEPARTMENT_OTHER): Payer: Self-pay | Admitting: Emergency Medicine

## 2015-07-08 DIAGNOSIS — Z7952 Long term (current) use of systemic steroids: Secondary | ICD-10-CM | POA: Insufficient documentation

## 2015-07-08 DIAGNOSIS — J40 Bronchitis, not specified as acute or chronic: Secondary | ICD-10-CM

## 2015-07-08 DIAGNOSIS — Z79899 Other long term (current) drug therapy: Secondary | ICD-10-CM | POA: Insufficient documentation

## 2015-07-08 DIAGNOSIS — J209 Acute bronchitis, unspecified: Secondary | ICD-10-CM | POA: Insufficient documentation

## 2015-07-08 MED ORDER — BENZONATATE 100 MG PO CAPS: 100.0000 mg | ORAL_CAPSULE | Freq: Three times a day (TID) | ORAL | Status: DC

## 2015-07-08 MED ORDER — HYDROCOD POLST-CPM POLST ER 10-8 MG/5ML PO SUER: 5.0000 mL | Freq: Two times a day (BID) | ORAL | Status: DC

## 2015-07-08 MED ORDER — AZITHROMYCIN 250 MG PO TABS: 250.0000 mg | ORAL_TABLET | Freq: Every day | ORAL | Status: DC

## 2015-07-08 NOTE — Discharge Instructions (Signed)
Rx as directed.  Motrin or Tylenol for fever, aches.  Rest, push fluids.

## 2015-07-08 NOTE — ED Provider Notes (Signed)
CSN: [id number]     Arrival date & time 07/08/15  1607 History  By signing my name below, I, Budd Palmer, attest that this documentation has been prepared under the direction and in the presence of Rolland Porter, MD. Electronically Signed: Budd Palmer, ED Scribe. 07/08/2015. 5:11 PM.      Chief Complaint  Patient presents with  . Cough   The history is provided by the patient. No language interpreter was used.   HPI Comments: Mike Casey is a 34 y.o. male who presents to the Emergency Department complaining of a productive cough (dark yellow sputum) onset 1 week ago. He reports associated nasal and chest congestion, yellow nasal discharge, subjective fever, and SOB. He states he has been taking OTC medication which brought down the fever. Pt denies CP. He notes NKDA.  History reviewed. No pertinent past medical history. History reviewed. No pertinent past surgical history. History reviewed. No pertinent family history. Social History  Substance Use Topics  . Smoking status: Never Smoker   . Smokeless tobacco: None  . Alcohol Use: No    Review of Systems  Constitutional: Positive for fever. Negative for chills, diaphoresis, appetite change and fatigue.  HENT: Positive for congestion and rhinorrhea. Negative for mouth sores, sore throat and trouble swallowing.   Eyes: Negative for visual disturbance.  Respiratory: Positive for cough and shortness of breath. Negative for chest tightness and wheezing.   Cardiovascular: Negative for chest pain.  Gastrointestinal: Negative for nausea, vomiting, abdominal pain, diarrhea and abdominal distention.  Endocrine: Negative for polydipsia, polyphagia and polyuria.  Genitourinary: Negative for dysuria, frequency and hematuria.  Musculoskeletal: Negative for gait problem.  Skin: Negative for color change, pallor and rash.  Neurological: Negative for dizziness, syncope, light-headedness and headaches.  Hematological: Does not bruise/bleed  easily.  Psychiatric/Behavioral: Negative for behavioral problems and confusion.    Allergies  Review of patient's allergies indicates no known allergies.  Home Medications   Prior to Admission medications   Medication Sig Start Date End Date Taking? Authorizing Provider  azithromycin (ZITHROMAX Z-PAK) 250 MG tablet Take 1 tablet (250 mg total) by mouth daily. 07/08/15   Rolland Porter, MD  benzonatate (TESSALON) 100 MG capsule Take 1 capsule (100 mg total) by mouth every 8 (eight) hours. 07/08/15   Rolland Porter, MD  chlorpheniramine-HYDROcodone [facility] PENNKINETIC ER) 10-8 MG/5ML SUER Take 5 mLs by mouth 2 (two) times daily. 07/08/15   Rolland Porter, MD  HYDROcodone-acetaminophen (NORCO/VICODIN) 5-325 MG per tablet Take 2 tablets by mouth every 4 (four) hours as needed for moderate pain or severe pain. 09/26/14   Nelva Nay, MD  hydrocortisone 2.5 % cream Apply 1 application topically daily.    Historical Provider, MD  ibuprofen (ADVIL,MOTRIN) 600 MG tablet Take 1 tablet (600 mg total) by mouth every 6 (six) hours as needed. 12/25/13   Derwood Kaplan, MD  Multiple Vitamin (MULTIVITAMIN WITH MINERALS) TABS Take 1 tablet by mouth daily.    Historical Provider, MD   BP 109/65 mmHg  Pulse 87  Temp(Src) 98.1 F (36.7 C) (Oral)  Resp 18  Ht 6' (1.829 m)  Wt 140 lb (63.504 kg)  BMI 18.98 kg/m2  SpO2 100% Physical Exam  Constitutional: He is oriented to person, place, and time. He appears well-developed and well-nourished. No distress.  HENT:  Head: Normocephalic.  Eyes: Conjunctivae are normal. Pupils are equal, round, and reactive to light. No scleral icterus.  Neck: Normal range of motion. Neck supple. No thyromegaly present.  Cardiovascular: Normal rate and  regular rhythm.  Exam reveals no gallop and no friction rub.   No murmur heard. Pulmonary/Chest: Effort normal. No respiratory distress. He has no wheezes. He has no rales.  Rhonchi heard in left lower lobe  Abdominal: Soft. Bowel  sounds are normal. He exhibits no distension. There is no tenderness. There is no rebound.  Musculoskeletal: Normal range of motion.  Neurological: He is alert and oriented to person, place, and time.  Skin: Skin is warm and dry. No rash noted.  Psychiatric: He has a normal mood and affect. His behavior is normal.    ED Course  Procedures   COORDINATION OF CARE: 5:09 PM - Discussed probable viral infection. Discussed plans to order 2 types of cough medications as well as antibiotics. Pt advised of plan for treatment and pt agrees.  Labs Review Labs Reviewed - No data to display  Imaging Review Dg Chest 2 View  07/08/2015   CLINICAL DATA:  Cold for the past week, with productive cough. Sinus congestion. Initial encounter.  EXAM: CHEST  2 VIEW  COMPARISON:  Chest radiograph performed 09/26/2014  FINDINGS: The lungs are well-aerated and clear. There is no evidence of focal opacification, pleural effusion or pneumothorax.  The heart is normal in size; the mediastinal contour is within normal limits. No acute osseous abnormalities are seen.  IMPRESSION: No acute cardiopulmonary process seen.   Electronically Signed   By: Roanna Raider M.D.   On: 07/08/2015 17:20   I have personally reviewed and evaluated these images and lab results as part of my medical decision-making.   EKG Interpretation None      MDM   Final diagnoses:  Bronchitis   I personally performed the services described in this documentation, which was scribed in my presence. The recorded information has been reviewed and is accurate.   Rolland Porter, MD 07/14/15 [phone]

## 2015-07-08 NOTE — ED Notes (Signed)
Pt states "cold" for the past week, with a productive cough with dark yellow sputum.  + sinus congestion.  Pt tried OTC medications with some relief.

## 2017-04-18 ENCOUNTER — Emergency Department (HOSPITAL_BASED_OUTPATIENT_CLINIC_OR_DEPARTMENT_OTHER): Payer: Self-pay

## 2017-04-18 ENCOUNTER — Encounter (HOSPITAL_BASED_OUTPATIENT_CLINIC_OR_DEPARTMENT_OTHER): Payer: Self-pay | Admitting: Emergency Medicine

## 2017-04-18 ENCOUNTER — Emergency Department (HOSPITAL_BASED_OUTPATIENT_CLINIC_OR_DEPARTMENT_OTHER)
Admission: EM | Admit: 2017-04-18 | Discharge: 2017-04-18 | Disposition: A | Discharge status: Home / Self Care | Payer: Self-pay | Attending: Emergency Medicine | Admitting: Emergency Medicine

## 2017-04-18 DIAGNOSIS — R109 Unspecified abdominal pain: Secondary | ICD-10-CM

## 2017-04-18 DIAGNOSIS — R1031 Right lower quadrant pain: Secondary | ICD-10-CM | POA: Insufficient documentation

## 2017-04-18 LAB — URINALYSIS, ROUTINE W REFLEX MICROSCOPIC
BILIRUBIN URINE: NEGATIVE
GLUCOSE, UA: NEGATIVE mg/dL
Hgb urine dipstick: NEGATIVE
KETONES UR: NEGATIVE mg/dL
LEUKOCYTES UA: NEGATIVE
NITRITE: NEGATIVE
PH: 7.5 (ref 5.0–8.0)
PROTEIN: NEGATIVE mg/dL
Specific Gravity, Urine: 1.02 (ref 1.005–1.030)

## 2017-04-18 MED ORDER — IBUPROFEN 600 MG PO TABS: 600.0000 mg | ORAL_TABLET | Freq: Four times a day (QID) | ORAL | 0 refills | Status: AC | PRN

## 2017-04-18 MED ORDER — METHOCARBAMOL 500 MG PO TABS: 1000.0000 mg | ORAL_TABLET | Freq: Four times a day (QID) | ORAL | 0 refills | Status: AC

## 2017-04-18 MED FILL — METHOCARBAMOL 500 MG TABLET: 500 | 3 days supply | Qty: 20 | Fill #0

## 2017-04-18 MED FILL — IBUPROFEN 600 MG TABLET: 600 | 5 days supply | Qty: 20 | Fill #0

## 2017-04-18 NOTE — ED Triage Notes (Signed)
Right flank pain for 4 days. Some urinary urgency.

## 2017-04-18 NOTE — ED Provider Notes (Signed)
[facility]   CSN: [id number] Arrival date & time: 04/18/17  1009     History   Chief Complaint Chief Complaint  Patient presents with  . Flank Pain    HPI Mike Casey is a 36 y.o. male.  Patient presents with intermittent right-sided flank pain described as sharp, severe at times, without radiation. Patient has been having difficulty sleeping and has been tossing and turning at night for a family member in room. No associated fevers, vomiting, diarrhea. No dysuria or hematuria. No history of kidney stones. Pain is made somewhat worse with movement but not with palpation. No treatments prior to arrival. No lower extremity weakness or numbness. The onset of this condition was acute. Alleviating factors: none.        No past medical history on file.  There are no active problems to display for this patient.   No past surgical history on file.     Home Medications    Prior to Admission medications   Medication Sig Start Date End Date Taking? Authorizing Provider  Multiple Vitamin (MULTIVITAMIN WITH MINERALS) TABS Take 1 tablet by mouth daily.    [provider]    Family History No family history on file.  Social History Social History  Substance Use Topics  . Smoking status: Never Smoker  . Smokeless tobacco: Never Used  . Alcohol use No     Allergies   Patient has no known allergies.   Review of Systems Review of Systems  Constitutional: Negative for fever.  HENT: Negative for rhinorrhea and sore throat.   Eyes: Negative for redness.  Respiratory: Negative for cough.   Cardiovascular: Negative for chest pain.  Gastrointestinal: Negative for abdominal pain, diarrhea, nausea and vomiting.  Genitourinary: Positive for flank pain. Negative for dysuria, frequency and hematuria.  Musculoskeletal: Positive for back pain. Negative for arthralgias and myalgias.  Skin: Negative for rash.  Neurological: Negative for  headaches.     Physical Exam Updated Vital Signs BP 120/72 (BP Location: Left Arm)   Pulse 80   Temp 98.4 F (36.9 C) (Oral)   Resp 16   Ht 6' (1.829 m)   Wt 63.5 kg (140 lb)   SpO2 100%   BMI 18.99 kg/m   Physical Exam  Constitutional: He appears well-developed and well-nourished.  HENT:  Head: Normocephalic and atraumatic.  Eyes: Conjunctivae are normal. Right eye exhibits no discharge. Left eye exhibits no discharge.  Neck: Normal range of motion. Neck supple.  Cardiovascular: Normal rate, regular rhythm and normal heart sounds.   Pulmonary/Chest: Effort normal and breath sounds normal.  Abdominal: Soft. There is no tenderness.  Musculoskeletal: He exhibits no tenderness.  No tenderness in the right lower back or flank to palpation.  Neurological: He is alert.  Skin: Skin is warm and dry.  Psychiatric: He has a normal mood and affect.  Nursing note and vitals reviewed.    ED Treatments / Results  Labs (all labs ordered are listed, but only abnormal results are displayed) Labs Reviewed  URINALYSIS, ROUTINE W REFLEX MICROSCOPIC    EKG  EKG Interpretation None       Radiology Ct Renal Stone Study  Result Date: 04/18/2017 CLINICAL DATA:  Left flank pain for 4 days. Evaluate for ureteral stone. EXAM: CT ABDOMEN AND PELVIS WITHOUT CONTRAST TECHNIQUE: Multidetector CT imaging of the abdomen and pelvis was performed following the standard protocol without IV contrast. COMPARISON:  None. FINDINGS: Lower chest:  Negative. Hepatobiliary: No focal liver  abnormality.No evidence of biliary obstruction or stone. Pancreas: Unremarkable. Spleen: Unremarkable. Adrenals/Urinary Tract: Negative adrenals. No hydronephrosis or stone. Unremarkable bladder. Stomach/Bowel:  No obstruction. No appendicitis. Vascular/Lymphatic: No acute vascular abnormality. No mass or adenopathy. Reproductive:No pathologic findings. Other: No ascites or pneumoperitoneum. Musculoskeletal: Negative  IMPRESSION: Negative study, no explanation for pain. No hydronephrosis or urolithiasis. Electronically Signed   By: Marnee SpringJonathon  Watts M.D.   On: 04/18/2017 11:37    Procedures Procedures (including critical care time)  Medications Ordered in ED Medications - No data to display   Initial Impression / Assessment and Plan / ED Course  I have reviewed the triage vital signs and the nursing notes.  Pertinent labs & imaging results that were available during my care of the patient were reviewed by me and considered in my medical decision making (see chart for details).     Patient seen and examined. UA ordered.  Vital signs reviewed and are as follows: BP 120/72 (BP Location: Left Arm)   Pulse 80   Temp 98.4 F (36.9 C) (Oral)   Resp 16   Ht 6' (1.829 m)   Wt 63.5 kg (140 lb)   SpO2 100%   BMI 18.99 kg/m   Patient informed of UA results. Given that there is no blood, discuss imaging versus treatment with watchful waiting. Discussed risks and benefits of CT imaging. Patient would like to proceed to rule out kidney stone given his recent symptoms that are suggestive of such. If negative, will treat for musculoskeletal pain with inflammatory some muscle relaxant. CT ordered.    12:34 PM patient updated on CT results. Will discharged home with ibuprofen and Robaxin. Confirmed no SOB or pain with breathing.   The patient was urged to return to the Emergency Department immediately with worsening of current symptoms, worsening abdominal pain, persistent vomiting, blood noted in stools, fever, or any other concerns. The patient verbalized understanding.    Final Clinical Impressions(s) / ED Diagnoses   Final diagnoses:  Flank pain   Patient with flank pain. Pain is minimally reproducible. After discussion with patient, due to concern for ureteral colic, CT ordered. Fortunately, this was negative. No other obvious cause noted on workup or imaging. Will treat as musculoskeletal pain with  anti-inflammatories and muscle relaxer. Encouraged return or follow-up as above. Patient without any difficulty breathing, shortness of breath, pleuritic-type pain to suggest PE.  New Prescriptions New Prescriptions   IBUPROFEN (ADVIL,MOTRIN) 600 MG TABLET    Take 1 tablet (600 mg total) by mouth every 6 (six) hours as needed.   METHOCARBAMOL (ROBAXIN) 500 MG TABLET    Take 2 tablets (1,000 mg total) by mouth 4 (four) times daily.     Renne CriglerGeiple, Johnathan Heskett, PA-C 04/18/17 1236    Rolland PorterJames, Mark, MD 04/20/17 [phone]

## 2017-04-18 NOTE — Discharge Instructions (Signed)
Please read and follow all provided instructions.  Your diagnoses today include:  1. Flank pain     Tests performed today include:  CT scan - does not show a stone or any other problem  Urine test to look for infection  Vital signs. See below for your results today.   Medications prescribed:   Ibuprofen (Motrin, Advil) - anti-inflammatory pain medication  Do not exceed 600mg  ibuprofen every 6 hours, take with food  You have been prescribed an anti-inflammatory medication or NSAID. Take with food. Take smallest effective dose for the shortest duration needed for your pain. Stop taking if you experience stomach pain or vomiting.    Robaxin (methocarbamol) - muscle relaxer medication  DO NOT drive or perform any activities that require you to be awake and alert because this medicine can make you drowsy.  Take any prescribed medications only as directed.  Home care instructions:   Follow any educational materials contained in this packet.  Follow-up instructions: Please follow-up with your primary care provider in the next 3 days for further evaluation of your symptoms.    Return instructions:  SEEK IMMEDIATE MEDICAL ATTENTION IF:  The pain does not go away or becomes severe   A temperature above 101F develops   Repeated vomiting occurs (multiple episodes)   The pain becomes localized to portions of the abdomen. The right side could possibly be appendicitis. In an adult, the left lower portion of the abdomen could be colitis or diverticulitis.   Blood is being passed in stools or vomit (bright red or black tarry stools)   You develop chest pain, difficulty breathing, dizziness or fainting, or become confused, poorly responsive, or inconsolable ([place] children)  If you have any other emergent concerns regarding your health  Additional Information: Abdominal (belly) pain can be caused by many things. Your caregiver performed an examination and possibly ordered  blood/urine tests and imaging (CT scan, x-rays, ultrasound). Many cases can be observed and treated at home after initial evaluation in the emergency department. Even though you are being discharged home, abdominal pain can be unpredictable. Therefore, you need a repeated exam if your pain does not resolve, returns, or worsens. Most patients with abdominal pain don't have to be admitted to the hospital or have surgery, but serious problems like appendicitis and gallbladder attacks can start out as nonspecific pain. Many abdominal conditions cannot be diagnosed in one visit, so follow-up evaluations are very important.  Your vital signs today were: BP 120/72 (BP Location: Left Arm)    Pulse 80    Temp 98.4 F (36.9 C) (Oral)    Resp 16    Ht 6' (1.829 m)    Wt 63.5 kg (140 lb)    SpO2 100%    BMI 18.99 kg/m  If your blood pressure (bp) was elevated above 135/85 this visit, please have this repeated by your doctor within one month. --------------

## 2017-08-18 ENCOUNTER — Encounter (HOSPITAL_BASED_OUTPATIENT_CLINIC_OR_DEPARTMENT_OTHER): Payer: Self-pay

## 2017-08-18 ENCOUNTER — Emergency Department (HOSPITAL_BASED_OUTPATIENT_CLINIC_OR_DEPARTMENT_OTHER)
Admission: EM | Admit: 2017-08-18 | Discharge: 2017-08-19 | Disposition: A | Discharge status: Home / Self Care | Payer: Self-pay | Attending: Emergency Medicine | Admitting: Emergency Medicine

## 2017-08-18 ENCOUNTER — Other Ambulatory Visit: Payer: Self-pay

## 2017-08-18 DIAGNOSIS — Z79899 Other long term (current) drug therapy: Secondary | ICD-10-CM | POA: Insufficient documentation

## 2017-08-18 DIAGNOSIS — M5441 Lumbago with sciatica, right side: Secondary | ICD-10-CM | POA: Insufficient documentation

## 2017-08-18 LAB — URINALYSIS, ROUTINE W REFLEX MICROSCOPIC
BILIRUBIN URINE: NEGATIVE
Glucose, UA: NEGATIVE mg/dL
KETONES UR: NEGATIVE mg/dL
Leukocytes, UA: NEGATIVE
NITRITE: NEGATIVE
Protein, ur: NEGATIVE mg/dL
Specific Gravity, Urine: >1.03 — ABNORMAL HIGH (ref 1.005–1.030)
pH: 5.5 (ref 5.0–8.0)

## 2017-08-18 LAB — URINALYSIS, MICROSCOPIC (REFLEX): WBC, UA: NONE SEEN WBC/hpf (ref 0–5)

## 2017-08-18 NOTE — ED Triage Notes (Addendum)
Pt c/o right lower back pain with right inguinal pain and pain and tingling radiating down right leg since Saturday, no swelling to leg, no injury, no difficulty urinating

## 2017-08-18 NOTE — ED Notes (Signed)
ED Provider at bedside. 

## 2017-08-18 NOTE — ED Provider Notes (Signed)
[facility] EMERGENCY DEPARTMENT Provider Note   CSN: [id number] Arrival date & time: 08/18/17  2004     History   Chief Complaint Chief Complaint  Patient presents with  . Back Pain    HPI Mike Casey is a 36 y.o. male who presents to the emergency department with acute on chronic right-sided low back pain that is worse with certain positions and intermittently radiates down his right thigh as sharp pain.  He reports that he was seen in the emergency department one other time for similar pain.  No treatment prior to arrival.  He denies fever, chills, IV drug use, history of cancer, history of DM, urinary or fecal incontinence, perianal numbness, abdominal pain, N/V/D, numbness, or weakness.  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  The history is provided by the patient. No language interpreter was used.  Back Pain   This is a recurrent problem. The current episode started 2 days ago. The pain is present in the lumbar spine. The quality of the pain is described as stabbing. The pain radiates to the right thigh. The pain is mild. The symptoms are aggravated by certain positions. The pain is the same all the time. Associated symptoms include tingling. Pertinent negatives include no chest pain, no fever, no numbness, no weight loss, no headaches, no abdominal pain, no abdominal swelling, no bowel incontinence, no perianal numbness, no bladder incontinence, no dysuria, no pelvic pain, no paresthesias, no paresis and no weakness. He has tried nothing for the symptoms. The treatment provided no relief.    History reviewed. No pertinent past medical history.  There are no active problems to display for this patient.   History reviewed. No pertinent surgical history.     Home Medications    Prior to Admission medications   Medication Sig Start Date End Date Taking? Authorizing Provider   diclofenac sodium (VOLTAREN) 1 % GEL Apply 4 g 4 (four) times daily topically. 08/19/17   Uriah Philipson A, PA-C  ibuprofen (ADVIL,MOTRIN) 600 MG tablet Take 1 tablet (600 mg total) by mouth every 6 (six) hours as needed. 04/18/17   Renne CriglerGeiple, Joshua, PA-C  methocarbamol (ROBAXIN) 500 MG tablet Take 2 tablets (1,000 mg total) by mouth 4 (four) times daily. 04/18/17   Renne CriglerGeiple, Joshua, PA-C  Multiple Vitamin (MULTIVITAMIN WITH MINERALS) TABS Take 1 tablet by mouth daily.    [provider]    Family History No family history on file.  Social History Social History   Tobacco Use  . Smoking status: Never Smoker  . Smokeless tobacco: Never Used  Substance Use Topics  . Alcohol use: No  . Drug use: No     Allergies   Patient has no known allergies.   Review of Systems Review of Systems  Constitutional: Negative for fever and weight loss.  Cardiovascular: Negative for chest pain.  Gastrointestinal: Negative for abdominal pain and bowel incontinence.  Genitourinary: Negative for bladder incontinence, dysuria and pelvic pain.  Musculoskeletal: Positive for back pain.  Neurological: Positive for tingling. Negative for weakness, numbness, headaches and paresthesias.   Physical Exam Updated Vital Signs BP 122/70 (BP Location: Right Arm)   Pulse 84   Temp 98 F (36.7 C) (Oral)   Resp 18   Ht 6' (1.829 m)   Wt 63.5 kg (140 lb)   SpO2 100%   BMI 18.99 kg/m   Physical Exam  Constitutional: He is oriented to person, place, and time. He appears well-developed.  HENT:  Head: Normocephalic.  Eyes: Conjunctivae are normal.  Neck: Neck supple.  Cardiovascular: Normal rate and regular rhythm.  No murmur heard. Pulmonary/Chest: Effort normal.  Abdominal: Soft. Bowel sounds are normal. He exhibits no distension and no mass. There is no tenderness. There is no rebound and no guarding. No hernia.  CVA tenderness bilaterally.  Genitourinary:  Genitourinary Comments: There is a  solitary mobile, mildly tender lymph node in the right inguinal area.  Musculoskeletal: Normal range of motion. He exhibits tenderness. He exhibits no edema or deformity.  Tender to palpation in the paraspinal muscles of the right lumbar spine.  No tenderness to palpation of the spinous processes of the cervical, thoracic or lumbar spine.  No left-sided paraspinal muscle tenderness.  Full active and passive range of motion of the bilateral hips, knees, and ankles.  No reproducible tenderness to palpation on the right thigh.  Sensation is intact throughout the bilateral lower extremities.  DP PT pulses are 2+ and symmetric.  Symmetric tandem gait.  Neurological: He is alert and oriented to person, place, and time.  Skin: Skin is warm and dry.  Psychiatric: His behavior is normal.  Nursing note and vitals reviewed.    ED Treatments / Results  Labs (all labs ordered are listed, but only abnormal results are displayed) Labs Reviewed  URINALYSIS, ROUTINE W REFLEX MICROSCOPIC - Abnormal; Notable for the following components:      Result Value   Specific Gravity, Urine >1.030 (*)    Hgb urine dipstick TRACE (*)    All other components within normal limits  URINALYSIS, MICROSCOPIC (REFLEX) - Abnormal; Notable for the following components:   Bacteria, UA RARE (*)    Squamous Epithelial / LPF 0-5 (*)    All other components within normal limits    EKG  EKG Interpretation None       Radiology No results found.  Procedures Procedures (including critical care time)  Medications Ordered in ED Medications - No data to display   Initial Impression / Assessment and Plan / ED Course  I have reviewed the triage vital signs and the nursing notes.  Pertinent labs & imaging results that were available during my care of the patient were reviewed by me and considered in my medical decision making (see chart for details).     Patient with back pain, mechanical in nature.  No neurological  deficits and normal neuro exam.  Patient is ambulatory.  No urinary or bowel incontinence.  No concern for cauda equina, infection, AAA, infection, or fracture.  No constitutional symptoms including fever, chills, or weight loss,  No h/o cancer, IVDU.  Will discharge to home with RICE protocol and anti-inflammatory medicine. Discussed ED return precaution and indications for PCP follow up. The patient acknowledges the plan and is agreeable at this time.   Final Clinical Impressions(s) / ED Diagnoses   Final diagnoses:  Acute right-sided low back pain with right-sided sciatica    ED Discharge Orders        Ordered    diclofenac sodium (VOLTAREN) 1 % GEL  4 times daily     08/19/17 0026       Frederik PearMcDonald, Shyloh Krinke A, PA-C 08/19/17 0036    Nira Connardama, Pedro Eduardo, MD 08/20/17 [phone]

## 2017-08-19 MED ORDER — DICLOFENAC SODIUM 1 % TD GEL: 4.0000 g | Freq: Four times a day (QID) | TRANSDERMAL | 0 refills | Status: AC

## 2017-08-19 NOTE — Discharge Instructions (Signed)
Schedule out anti-inflammatories, such as ibuprofen. You can take 600 mg every 6 hours with food for the next 4-5 days around the clock. Apply diclofenac gel to areas that are sore up to 4 times daily.   You can start performing stretches of her low back as your pain allows.   If you develop new or worsening symptoms, including appear urine total turns a dark brown color, if you develop pain or swelling to your penis or testicles, if you develop weakness in the right leg, fever chills, or other concerning symptoms, please return to the emergency department for reevaluation.

## 2018-10-23 IMAGING — CT CT RENAL STONE PROTOCOL
2 of 4 series · 16 of 46 positions shown, 18 images · non-contrast
Comparison: None.

CLINICAL DATA: Left flank pain for 4 days. Evaluate for ureteral
stone.

EXAM:
CT ABDOMEN AND PELVIS WITHOUT CONTRAST
TECHNIQUE: Multidetector CT imaging of the abdomen and pelvis was performed
following the standard protocol without IV contrast.

[Series 2: axial st · axial · 0.80mm/px · z∈[-596,-96]mm · 13 of 110 slices shown, 15 images]
[im 5/110  soft-tissue]
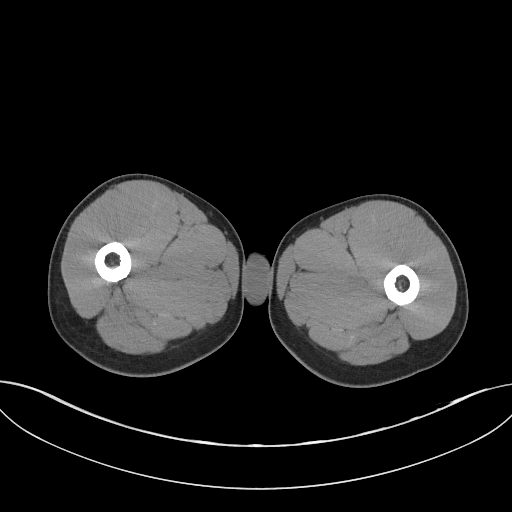
[im 5/110  bone]
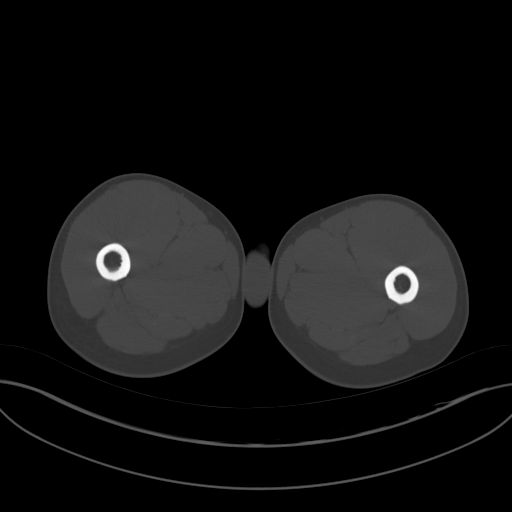
[im 14/110  soft-tissue]
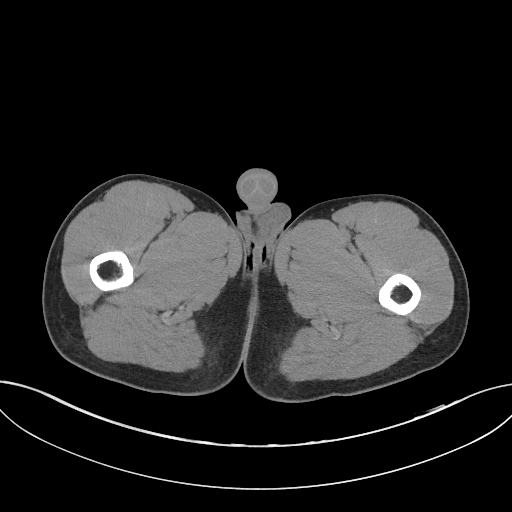
[im 22/110  soft-tissue]
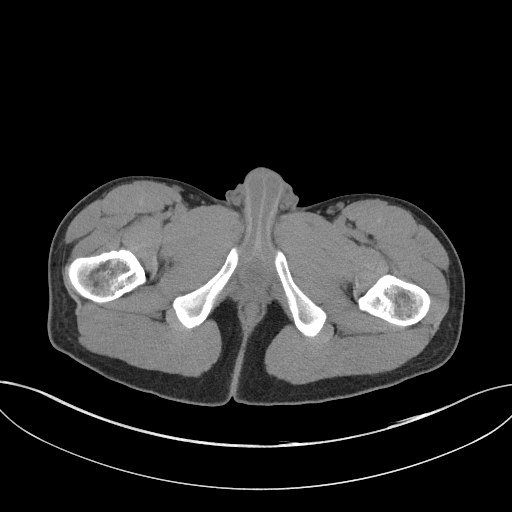
[im 31/110  soft-tissue]
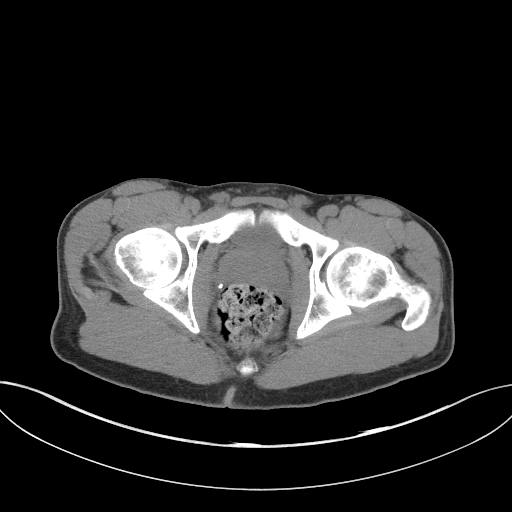
[im 40/110  soft-tissue]
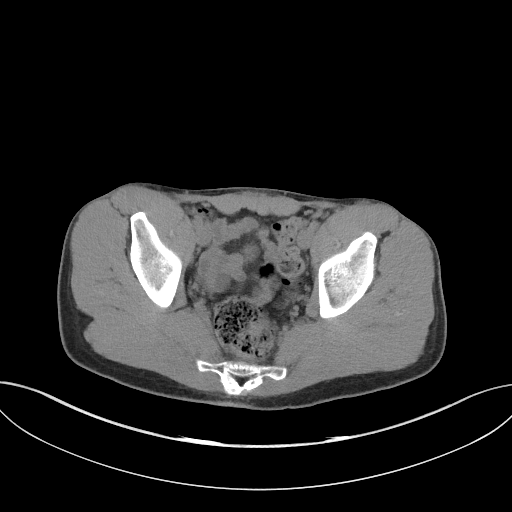
[im 48/110  soft-tissue]
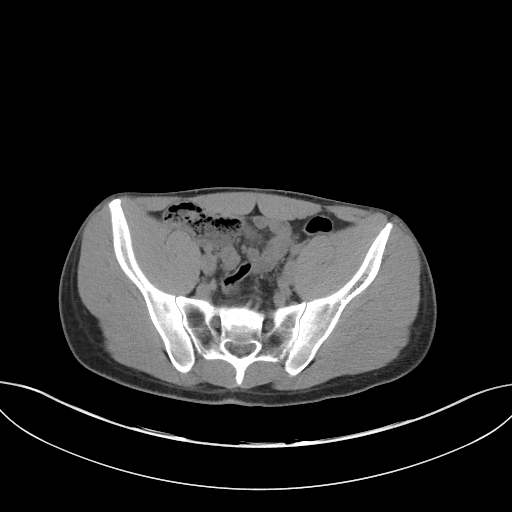
[im 57/110  soft-tissue]
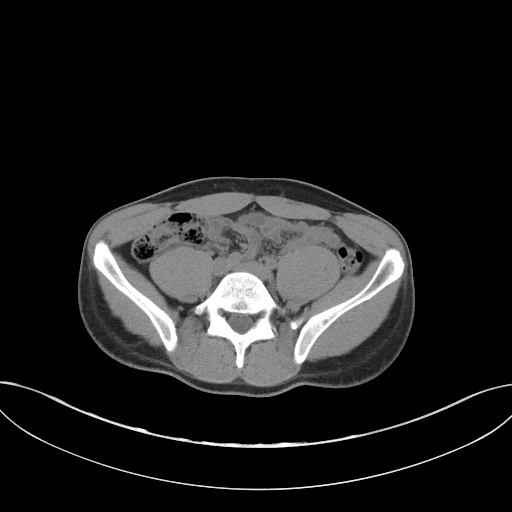
[im 62/110  soft-tissue]
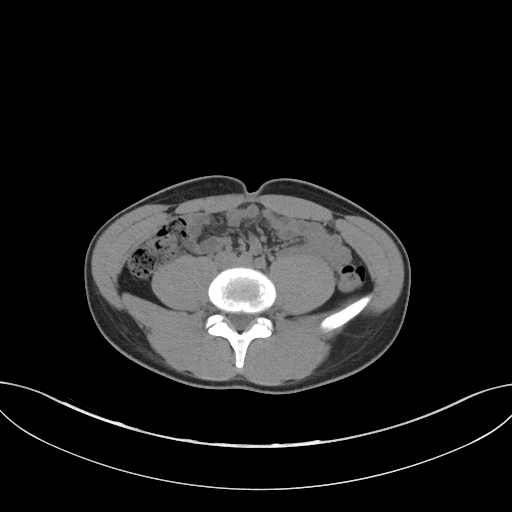
[im 70/110  soft-tissue]
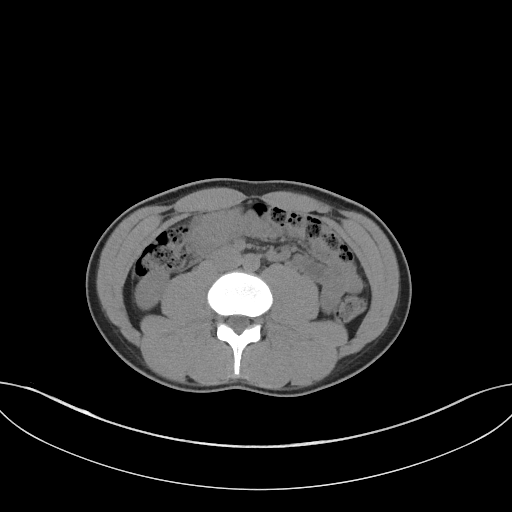
[im 70/110  bone]
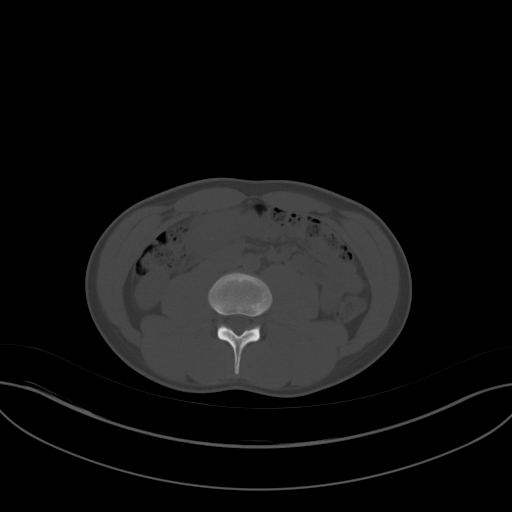
[im 79/110  soft-tissue]
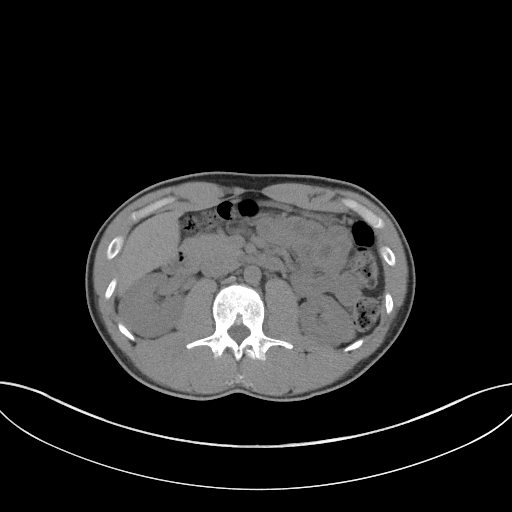
[im 88/110  soft-tissue]
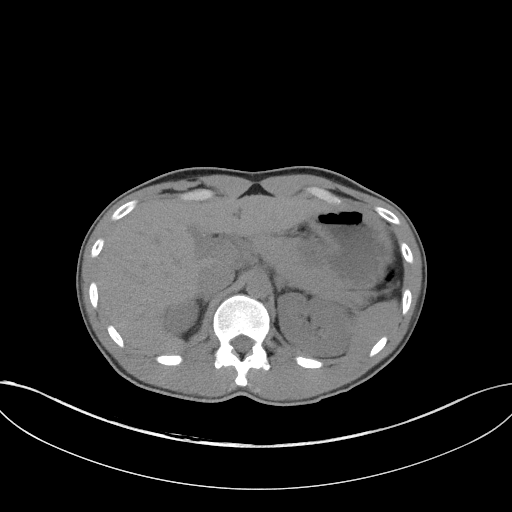
[im 96/110  soft-tissue]
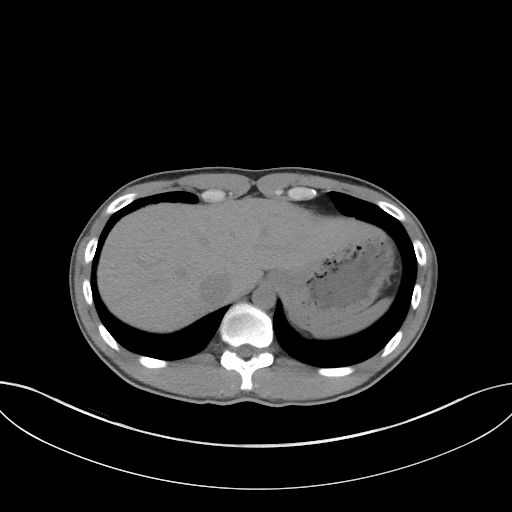
[im 105/110  soft-tissue]
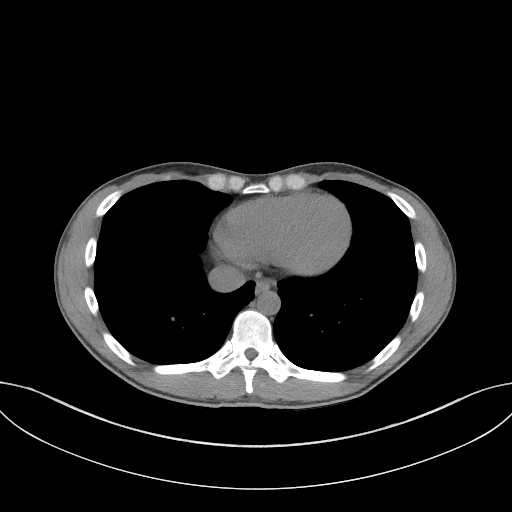

[Series 4: coronal st · coronal · 0.85mm/px · 3 of 72 slices shown]
[im 24/72  soft-tissue]
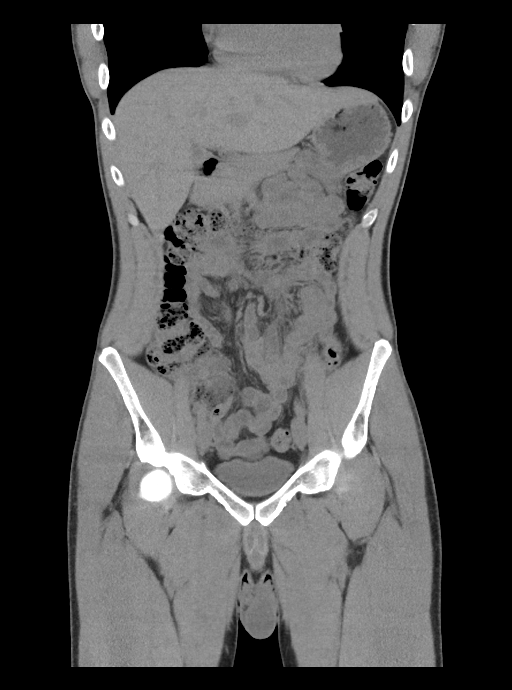
[im 32/72  soft-tissue]
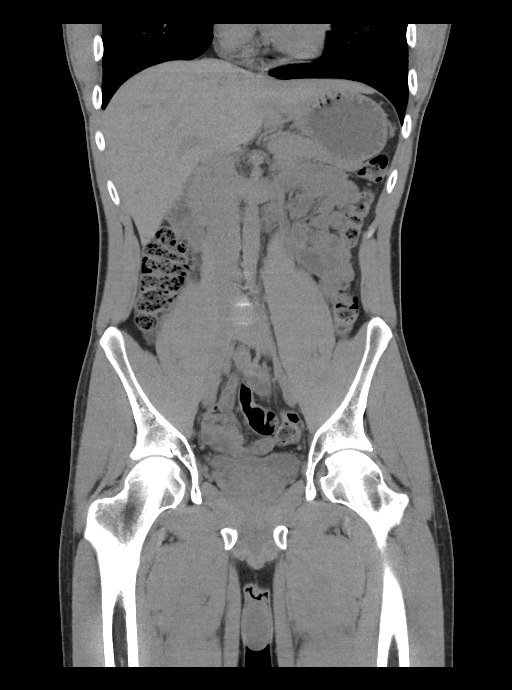
[im 40/72  soft-tissue]
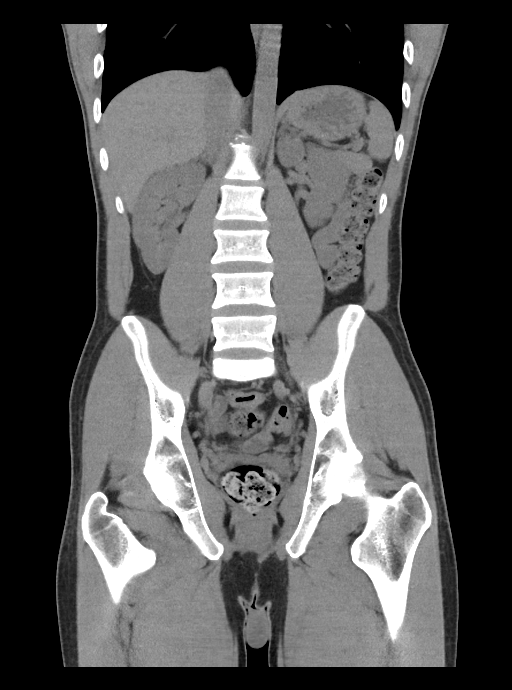

[16 of 46 positions shown; findings below may reference images not displayed]

FINDINGS: Lower chest:  Negative.

Hepatobiliary: No focal liver abnormality.No evidence of biliary
obstruction or stone.

Pancreas: Unremarkable.

Spleen: Unremarkable.

Adrenals/Urinary Tract: Negative adrenals. No hydronephrosis or
stone. Unremarkable bladder.

Stomach/Bowel:  No obstruction. No appendicitis.

Vascular/Lymphatic: No acute vascular abnormality. No mass or
adenopathy.

Reproductive:No pathologic findings.

Other: No ascites or pneumoperitoneum.

Musculoskeletal: Negative
IMPRESSION: Negative study, no explanation for pain. No hydronephrosis or
urolithiasis.

## 2018-12-17 ENCOUNTER — Emergency Department (HOSPITAL_BASED_OUTPATIENT_CLINIC_OR_DEPARTMENT_OTHER)
Admission: EM | Admit: 2018-12-17 | Discharge: 2018-12-17 | Disposition: A | Discharge status: Home / Self Care | Payer: BLUE CROSS/BLUE SHIELD | Attending: Emergency Medicine | Admitting: Emergency Medicine

## 2018-12-17 ENCOUNTER — Encounter (HOSPITAL_BASED_OUTPATIENT_CLINIC_OR_DEPARTMENT_OTHER): Payer: Self-pay | Admitting: Emergency Medicine

## 2018-12-17 ENCOUNTER — Other Ambulatory Visit: Payer: Self-pay

## 2018-12-17 DIAGNOSIS — J029 Acute pharyngitis, unspecified: Secondary | ICD-10-CM

## 2018-12-17 DIAGNOSIS — Z79899 Other long term (current) drug therapy: Secondary | ICD-10-CM | POA: Insufficient documentation

## 2018-12-17 LAB — GROUP A STREP BY PCR: Group A Strep by PCR: NOT DETECTED

## 2018-12-17 MED ORDER — NAPROXEN 500 MG PO TABS: 500.0000 mg | ORAL_TABLET | Freq: Two times a day (BID) | ORAL | 0 refills | Status: AC

## 2018-12-17 MED ORDER — FLUTICASONE PROPIONATE 50 MCG/ACT NA SUSP: 1.0000 | Freq: Every day | NASAL | 0 refills | Status: AC

## 2018-12-17 MED ORDER — BENZONATATE 100 MG PO CAPS: 100.0000 mg | ORAL_CAPSULE | Freq: Three times a day (TID) | ORAL | 0 refills | Status: DC

## 2018-12-17 NOTE — ED Provider Notes (Signed)
[facility] EMERGENCY DEPARTMENT Provider Note   CSN: [id number] Arrival date & time: 12/17/18  4503    History   Chief Complaint Chief Complaint  Patient presents with  . Influenza    HPI Mike Casey is a 38 y.o. male without significant past medical hx who presents to the emergency department with URI symptoms that began 2 days prior.  Patient reports nasal congestion, sinus headaches, sore throat, chills, subjective fevers, and very minimal cough.  No alleviating or aggravating factors.  No intervention prior to arrival.  Denies body aches, chest pain, dyspnea, emesis, abdominal pain, diarrhea, recent travel, or code 19 exposure.    HPI  History reviewed. No pertinent past medical history.  There are no active problems to display for this patient.   History reviewed. No pertinent surgical history.      Home Medications    Prior to Admission medications   Medication Sig Start Date End Date Taking? Authorizing Provider  diclofenac sodium (VOLTAREN) 1 % GEL Apply 4 g 4 (four) times daily topically. 08/19/17   McDonald, Mia A, PA-C  ibuprofen (ADVIL,MOTRIN) 600 MG tablet Take 1 tablet (600 mg total) by mouth every 6 (six) hours as needed. 04/18/17   Renne Crigler, PA-C  methocarbamol (ROBAXIN) 500 MG tablet Take 2 tablets (1,000 mg total) by mouth 4 (four) times daily. 04/18/17   Renne Crigler, PA-C  Multiple Vitamin (MULTIVITAMIN WITH MINERALS) TABS Take 1 tablet by mouth daily.    [provider]    Family History History reviewed. No pertinent family history.  Social History Social History   Tobacco Use  . Smoking status: Never Smoker  . Smokeless tobacco: Never Used  Substance Use Topics  . Alcohol use: No  . Drug use: No     Allergies   Patient has no known allergies.   Review of Systems Review of Systems  Constitutional: Positive for chills and fever (subjective).  HENT: Positive for congestion and sore throat. Negative for  drooling, ear pain, sinus pain, trouble swallowing and voice change.   Eyes: Negative for visual disturbance.  Respiratory: Positive for cough. Negative for shortness of breath.   Cardiovascular: Negative for chest pain.  Gastrointestinal: Negative for abdominal pain, diarrhea and vomiting.  Neurological: Positive for headaches. Negative for dizziness, seizures, weakness and numbness.     Physical Exam Updated Vital Signs BP (!) 111/91 (BP Location: Right Arm)   Pulse 82   Temp 98.2 F (36.8 C) (Oral)   Resp 18   Ht 6' (1.829 m)   Wt 63.5 kg   SpO2 100%   BMI 18.99 kg/m   Physical Exam Vitals signs and nursing note reviewed.  Constitutional:      General: He is not in acute distress.    Appearance: He is well-developed. He is not toxic-appearing.  HENT:     Head: Normocephalic and atraumatic.     Right Ear: Tympanic membrane, ear canal and external ear normal.     Left Ear: Tympanic membrane, ear canal and external ear normal.     Nose: Congestion present.     Comments: No sinus tenderness.    Mouth/Throat:     Mouth: Mucous membranes are moist.     Pharynx: Posterior oropharyngeal erythema present. No oropharyngeal exudate.     Comments: Posterior oropharynx is symmetric appearing. Patient tolerating own secretions without difficulty. No trismus. No drooling. No hot potato voice. No swelling beneath the tongue, submandibular compartment is soft.  Eyes:  General:        Right eye: No discharge.        Left eye: No discharge.     Extraocular Movements: Extraocular movements intact.     Conjunctiva/sclera: Conjunctivae normal.     Pupils: Pupils are equal, round, and reactive to light.  Neck:     Musculoskeletal: Neck supple.  Cardiovascular:     Rate and Rhythm: Normal rate and regular rhythm.  Pulmonary:     Effort: Pulmonary effort is normal. No respiratory distress.     Breath sounds: Normal breath sounds. No wheezing, rhonchi or rales.  Abdominal:      General: There is no distension.     Palpations: Abdomen is soft.     Tenderness: There is no abdominal tenderness.  Skin:    General: Skin is warm and dry.     Findings: No rash.  Neurological:     General: No focal deficit present.     Mental Status: He is alert.     Comments: Clear speech.   Psychiatric:        Behavior: Behavior normal.      ED Treatments / Results  Labs (all labs ordered are listed, but only abnormal results are displayed) Labs Reviewed - No data to display  EKG None  Radiology No results found.  Procedures Procedures (including critical care time)  Medications Ordered in ED Medications - No data to display   Initial Impression / Assessment and Plan / ED Course  I have reviewed the triage vital signs and the nursing notes.  Pertinent labs & imaging results that were available during my care of the patient were reviewed by me and considered in my medical decision making (see chart for details).    Patient presents with URI type symptoms.  Patient is nontoxic appearing, in no apparent distress, vitals are WNL other than mildly elevated diastolic BP- PCP recheck. Patient is afebrile in the ED, lungs are CTA, doubt pneumonia. There is no wheezing or signs of respiratory distress. Sxs onset < 7 days, afebrile, no sinus tenderness, doubt acute bacterial sinusitis. Strep negative, exam not consistent with RPA/PTA. No evidence of AOM on exam. No meningeal signs. No travel or covid 19 exposures, per hospital protocol does not meet criteria for coronavirus testing, additionally feel this diagnoses is less likely. Will provide quarantine guidelines for precautionary measures. Suspect viral vs allergic etiology at this time and will treat supportively with Naproxen, Flonase, and Tessalon. I discussed results, treatment plan, need for PCP follow-up, and return precautions with the patient. Provided opportunity for questions, patient confirmed understanding and is in  agreement with plan.     Final Clinical Impressions(s) / ED Diagnoses   Final diagnoses:  Sore throat    ED Discharge Orders         Ordered    fluticasone (FLONASE) 50 MCG/ACT nasal spray  Daily     12/17/18 2115    naproxen (NAPROSYN) 500 MG tablet  2 times daily     12/17/18 2115    benzonatate (TESSALON) 100 MG capsule  Every 8 hours     12/17/18 2115           Cindia Hustead, Pleas Koch, PA-C 12/17/18 2119    Rolan Bucco, MD 12/17/18 2150

## 2018-12-17 NOTE — Discharge Instructions (Addendum)
You were seen in the emergency department today for upper respiratory infection type symptoms.  Your strep test was negative.  We suspect this is a virus at this time.  We are sending you home with Flonase.with congestion, Tessalon to help with cough, naproxen to help with pain.  -Flonase to be used 1 spray in each nostril daily.  This medication is used to treat your congestion.  -Tessalon can be taken once every 8 hours as needed.  This medication is used to treat your cough.  - Naproxen is a nonsteroidal anti-inflammatory medication that will help with pain and swelling. Be sure to take this medication as prescribed with food, 1 pill every 12 hours,  It should be taken with food, as it can cause stomach upset, and more seriously, stomach bleeding. Do not take other nonsteroidal anti-inflammatory medications with this such as Advil, Motrin, Aleve, Mobic, Goodie Powder, or Motrin.    You make take Tylenol per over the counter dosing with these medications.   We have prescribed you new medication(s) today. Discuss the medications prescribed today with your pharmacist as they can have adverse effects and interactions with your other medicines including over the counter and prescribed medications. Seek medical evaluation if you start to experience new or abnormal symptoms after taking one of these medicines, seek care immediately if you start to experience difficulty breathing, feeling of your throat closing, facial swelling, or rash as these could be indications of a more serious allergic reaction  At this time we have limited testing for coronavirus.  We were unable to test you for this today.  For precautionary measures we are recommending anyone with a fever and cough quarantined themselves for  14 days.  Please see the above instructions for quarantine.  Should you meet the following criteria your quarantine can be discontinued early: -At least 7 days have passed since symptoms first appeared AND -  At least 3 days (72 hours) have passed since recovery defined as resolution of fever without the use of fever-reducing medications and improvement in respiratory symptoms (e.g., cough, shortness of breath);  Please follow-up with primary care within 1 week for reevaluation.  Return to the ER for new or worsening symptoms including but not limited to worsening pain, increased work of breathing, inability to keep fluids down, or any other concerns.      Person Under Monitoring Name: Mike Casey  Location: [address] [place] [address]   Infection Prevention Recommendations for Individuals Confirmed to have, or Being Evaluated for, 2019 Novel Coronavirus (COVID-19) Infection Who Receive Care at Home  Individuals who are confirmed to have, or are being evaluated for, COVID-19 should follow the prevention steps below until a healthcare provider or local or state health department says they can return to normal activities.  Stay home except to get medical care You should restrict activities outside your home, except for getting medical care. Do not go to work, school, or public areas, and do not use public transportation or taxis.  Call ahead before visiting your doctor Before your medical appointment, call the healthcare provider and tell them that you have, or are being evaluated for, COVID-19 infection. This will help the healthcare providers office take steps to keep other people from getting infected. Ask your healthcare provider to call the local or state health department.  Monitor your symptoms Seek prompt medical attention if your illness is worsening (e.g., difficulty breathing). Before going to your medical appointment, call the healthcare provider and tell  them that you have, or are being evaluated for, COVID-19 infection. Ask your healthcare provider to call the local or state health department.  Wear a facemask You should wear a facemask that covers your nose and  mouth when you are in the same room with other people and when you visit a healthcare provider. People who live with or visit you should also wear a facemask while they are in the same room with you.  Separate yourself from other people in your home As much as possible, you should stay in a different room from other people in your home. Also, you should use a separate bathroom, if available.  Avoid sharing household items You should not share dishes, drinking glasses, cups, eating utensils, towels, bedding, or other items with other people in your home. After using these items, you should wash them thoroughly with soap and water.  Cover your coughs and sneezes Cover your mouth and nose with a tissue when you cough or sneeze, or you can cough or sneeze into your sleeve. Throw used tissues in a lined trash can, and immediately wash your hands with soap and water for at least 20 seconds or use an alcohol-based hand rub.  Wash your [employer] your hands often and thoroughly with soap and water for at least 20 seconds. You can use an alcohol-based hand sanitizer if soap and water are not available and if your hands are not visibly dirty. Avoid touching your eyes, nose, and mouth with unwashed hands.   Prevention Steps for Caregivers and Household Members of Individuals Confirmed to have, or Being Evaluated for, COVID-19 Infection Being Cared for in the Home  If you live with, or provide care at home for, a person confirmed to have, or being evaluated for, COVID-19 infection please follow these guidelines to prevent infection:  Follow healthcare providers instructions Make sure that you understand and can help the patient follow any healthcare provider instructions for all care.  Provide for the patients basic needs You should help the patient with basic needs in the home and provide support for getting groceries, prescriptions, and other personal needs.  Monitor the patients  symptoms If they are getting sicker, call his or her medical provider and tell them that the patient has, or is being evaluated for, COVID-19 infection. This will help the healthcare providers office take steps to keep other people from getting infected. Ask the healthcare provider to call the local or state health department.  Limit the number of people who have contact with the patient If possible, have only one caregiver for the patient. Other household members should stay in another home or place of residence. If this is not possible, they should stay in another room, or be separated from the patient as much as possible. Use a separate bathroom, if available. Restrict visitors who do not have an essential need to be in the home.  Keep older adults, very Schul children, and other sick people away from the patient Keep older adults, very Jankowski children, and those who have compromised immune systems or chronic health conditions away from the patient. This includes people with chronic heart, lung, or kidney conditions, diabetes, and cancer.  Ensure good ventilation Make sure that shared spaces in the home have good air flow, such as from an air conditioner or an opened window, weather permitting.  Wash your hands often Wash your hands often and thoroughly with soap and water for at least 20 seconds. You can use an alcohol  based hand sanitizer if soap and water are not available and if your hands are not visibly dirty. Avoid touching your eyes, nose, and mouth with unwashed hands. Use disposable paper towels to dry your hands. If not available, use dedicated cloth towels and replace them when they become wet.  Wear a facemask and gloves Wear a disposable facemask at all times in the room and gloves when you touch or have contact with the patients blood, body fluids, and/or secretions or excretions, such as sweat, saliva, sputum, nasal mucus, vomit, urine, or feces.  Ensure the mask fits over  your nose and mouth tightly, and do not touch it during use. Throw out disposable facemasks and gloves after using them. Do not reuse. Wash your hands immediately after removing your facemask and gloves. If your personal clothing becomes contaminated, carefully remove clothing and launder. Wash your hands after handling contaminated clothing. Place all used disposable facemasks, gloves, and other waste in a lined container before disposing them with other household waste. Remove gloves and wash your hands immediately after handling these items.  Do not share dishes, glasses, or other household items with the patient Avoid sharing household items. You should not share dishes, drinking glasses, cups, eating utensils, towels, bedding, or other items with a patient who is confirmed to have, or being evaluated for, COVID-19 infection. After the person uses these items, you should wash them thoroughly with soap and water.  Wash laundry thoroughly Immediately remove and wash clothes or bedding that have blood, body fluids, and/or secretions or excretions, such as sweat, saliva, sputum, nasal mucus, vomit, urine, or feces, on them. Wear gloves when handling laundry from the patient. Read and follow directions on labels of laundry or clothing items and detergent. In general, wash and dry with the warmest temperatures recommended on the label.  Clean all areas the individual has used often Clean all touchable surfaces, such as counters, tabletops, doorknobs, bathroom fixtures, toilets, phones, keyboards, tablets, and bedside tables, every day. Also, clean any surfaces that may have blood, body fluids, and/or secretions or excretions on them. Wear gloves when cleaning surfaces the patient has come in contact with. Use a diluted bleach solution (e.g., dilute bleach with 1 part bleach and 10 parts water) or a household disinfectant with a label that says EPA-registered for coronaviruses. To make a bleach  solution at home, add 1 tablespoon of bleach to 1 quart (4 cups) of water. For a larger supply, add  cup of bleach to 1 gallon (16 cups) of water. Read labels of cleaning products and follow recommendations provided on product labels. Labels contain instructions for safe and effective use of the cleaning product including precautions you should take when applying the product, such as wearing gloves or eye protection and making sure you have good ventilation during use of the product. Remove gloves and wash hands immediately after cleaning.  Monitor yourself for signs and symptoms of illness Caregivers and household members are considered close contacts, should monitor their health, and will be asked to limit movement outside of the home to the extent possible. Follow the monitoring steps for close contacts listed on the symptom monitoring form.   ? If you have additional questions, contact your local health department or call the epidemiologist on call at [phone] (available 24/7). ? This guidance is subject to change. For the most up-to-date guidance from [facility], please refer to their website: [employer]

## 2018-12-17 NOTE — ED Triage Notes (Signed)
Pt having flu like symptoms with sore throat, chills and headache for the past 2 days.

## 2019-07-07 ENCOUNTER — Other Ambulatory Visit: Payer: Self-pay

## 2019-07-07 DIAGNOSIS — Z20822 Contact with and (suspected) exposure to covid-19: Secondary | ICD-10-CM

## 2019-07-08 LAB — NOVEL CORONAVIRUS, NAA: SARS-CoV-2, NAA: NOT DETECTED

## 2019-08-06 ENCOUNTER — Other Ambulatory Visit: Payer: Self-pay

## 2019-08-06 DIAGNOSIS — Z20822 Contact with and (suspected) exposure to covid-19: Secondary | ICD-10-CM

## 2019-08-07 LAB — NOVEL CORONAVIRUS, NAA: SARS-CoV-2, NAA: NOT DETECTED

## 2019-08-11 ENCOUNTER — Other Ambulatory Visit: Payer: Self-pay

## 2019-08-11 DIAGNOSIS — Z20822 Contact with and (suspected) exposure to covid-19: Secondary | ICD-10-CM

## 2019-08-13 LAB — NOVEL CORONAVIRUS, NAA: SARS-CoV-2, NAA: DETECTED — AB

## 2020-10-13 ENCOUNTER — Encounter (HOSPITAL_BASED_OUTPATIENT_CLINIC_OR_DEPARTMENT_OTHER): Payer: Self-pay | Admitting: *Deleted

## 2020-10-13 ENCOUNTER — Other Ambulatory Visit: Payer: Self-pay

## 2020-10-13 ENCOUNTER — Emergency Department (HOSPITAL_BASED_OUTPATIENT_CLINIC_OR_DEPARTMENT_OTHER)
Admission: EM | Admit: 2020-10-13 | Discharge: 2020-10-13 | Disposition: A | Discharge status: Home / Self Care | Payer: 59 | Attending: Emergency Medicine | Admitting: Emergency Medicine

## 2020-10-13 DIAGNOSIS — R300 Dysuria: Secondary | ICD-10-CM | POA: Diagnosis not present

## 2020-10-13 DIAGNOSIS — M545 Low back pain, unspecified: Secondary | ICD-10-CM | POA: Insufficient documentation

## 2020-10-13 DIAGNOSIS — R109 Unspecified abdominal pain: Secondary | ICD-10-CM | POA: Insufficient documentation

## 2020-10-13 LAB — URINALYSIS, ROUTINE W REFLEX MICROSCOPIC
Bilirubin Urine: NEGATIVE
Glucose, UA: NEGATIVE mg/dL
Hgb urine dipstick: NEGATIVE
Ketones, ur: NEGATIVE mg/dL
Leukocytes,Ua: NEGATIVE
Nitrite: NEGATIVE
Protein, ur: NEGATIVE mg/dL
Specific Gravity, Urine: 1.025 (ref 1.005–1.030)
pH: 5 (ref 5.0–8.0)

## 2020-10-13 LAB — BASIC METABOLIC PANEL
Anion gap: 7 (ref 5–15)
BUN: 13 mg/dL (ref 6–20)
CO2: 27 mmol/L (ref 22–32)
Calcium: 8.8 mg/dL — ABNORMAL LOW (ref 8.9–10.3)
Chloride: 102 mmol/L (ref 98–111)
Creatinine, Ser: 0.89 mg/dL (ref 0.61–1.24)
GFR, Estimated: >60 mL/min (ref >60)
Glucose, Bld: 97 mg/dL (ref 70–99)
Potassium: 4.1 mmol/L (ref 3.5–5.1)
Sodium: 136 mmol/L (ref 135–145)

## 2020-10-13 LAB — CBG MONITORING, ED: Glucose-Capillary: 86 mg/dL (ref 70–99)

## 2020-10-13 NOTE — ED Notes (Signed)
Patient verbalizes understanding of discharge instructions. Opportunity for questioning and answers were provided. Armband removed by staff, pt discharged from ED ambulatory to home.  

## 2020-10-13 NOTE — ED Triage Notes (Signed)
Intermittent right flank pain for a year and it is getting worst.  Also complaint of urinary frequency.

## 2020-10-13 NOTE — Discharge Instructions (Addendum)
You have been seen and discharged from the emergency department.  Follow-up with your primary provider for reevaluation. Take home medications as prescribed.  Stay well-hydrated.  If you have any worsening symptoms or further concerns for health please return to an emergency department for further evaluation.

## 2020-10-13 NOTE — ED Provider Notes (Signed)
[facility] EMERGENCY DEPARTMENT Provider Note   CSN: [id number] Arrival date & time: 10/13/20  1848     History Chief Complaint  Patient presents with  . Flank Pain    Mike Casey is a 40 y.o. male.  HPI   40 year old male presents emergency department with right lower back pain.  Patient states that this has been off and on for weeks up to a month.  He seen his primary doctor who has recommended physical therapy.  Over the last week the patient has noticed concentrated urine and intermittent discomfort with voiding.  No retention, penile discharge, hematuria.  No history of kidney stones.  No injury to the lower back.  History reviewed. No pertinent past medical history.  There are no problems to display for this patient.   History reviewed. No pertinent surgical history.     History reviewed. No pertinent family history.  Social History   Tobacco Use  . Smoking status: Never Smoker  . Smokeless tobacco: Never Used  Substance Use Topics  . Alcohol use: Yes    Comment: occasionally  . Drug use: No    Home Medications Prior to Admission medications   Medication Sig Start Date End Date Taking? Authorizing Provider  benzonatate (TESSALON) 100 MG capsule Take 1 capsule (100 mg total) by mouth every 8 (eight) hours. 12/17/18   Petrucelli, Samantha R, PA-C  diclofenac sodium (VOLTAREN) 1 % GEL Apply 4 g 4 (four) times daily topically. 08/19/17   McDonald, Mia A, PA-C  fluticasone (FLONASE) 50 MCG/ACT nasal spray Place 1 spray into both nostrils daily. 12/17/18   Petrucelli, Samantha R, PA-C  ibuprofen (ADVIL,MOTRIN) 600 MG tablet Take 1 tablet (600 mg total) by mouth every 6 (six) hours as needed. 04/18/17   Renne Crigler, PA-C  methocarbamol (ROBAXIN) 500 MG tablet Take 2 tablets (1,000 mg total) by mouth 4 (four) times daily. 04/18/17   Renne Crigler, PA-C  Multiple Vitamin (MULTIVITAMIN WITH MINERALS) TABS Take 1 tablet by mouth daily.    [provider]  naproxen (NAPROSYN) 500 MG tablet Take 1 tablet (500 mg total) by mouth 2 (two) times daily. 12/17/18   Petrucelli, Pleas Koch, PA-C    Allergies    Patient has no known allergies.  Review of Systems   Review of Systems  Constitutional: Negative for chills and fever.  Respiratory: Negative for shortness of breath.   Cardiovascular: Negative for chest pain.  Gastrointestinal: Negative for abdominal pain, diarrhea and vomiting.  Genitourinary: Positive for dysuria and flank pain.  Musculoskeletal: Positive for back pain.  Skin: Negative for rash.  Neurological: Negative for headaches.    Physical Exam Updated Vital Signs BP 115/84 (BP Location: Right Arm)   Pulse 66   Temp 98.3 F (36.8 C) (Oral)   Resp 16   Ht 6' (1.829 m)   Wt 65.8 kg   SpO2 100%   BMI 19.67 kg/m   Physical Exam Vitals and nursing note reviewed.  Constitutional:      Appearance: Normal appearance.  HENT:     Head: Normocephalic.     Mouth/Throat:     Mouth: Mucous membranes are moist.  Cardiovascular:     Rate and Rhythm: Normal rate.  Pulmonary:     Effort: Pulmonary effort is normal. No respiratory distress.  Abdominal:     Palpations: Abdomen is soft.     Tenderness: There is no abdominal tenderness.  Musculoskeletal:     Comments: No significant lower back/flank pain are  reproducible with palpation, no overlying skin changes, no midline spinal tenderness  Skin:    General: Skin is warm.  Neurological:     Mental Status: He is alert and oriented to person, place, and time. Mental status is at baseline.  Psychiatric:        Mood and Affect: Mood normal.     ED Results / Procedures / Treatments   Labs (all labs ordered are listed, but only abnormal results are displayed) Labs Reviewed  BASIC METABOLIC PANEL - Abnormal; Notable for the following components:      Result Value   Calcium 8.8 (*)    All other components within normal limits  URINALYSIS, ROUTINE W REFLEX  MICROSCOPIC  CBG MONITORING, ED    EKG None  Radiology No results found.  Procedures Procedures (including critical care time)  Medications Ordered in ED Medications - No data to display  ED Course  I have reviewed the triage vital signs and the nursing notes.  Pertinent labs & imaging results that were available during my care of the patient were reviewed by me and considered in my medical decision making (see chart for details).    MDM Rules/Calculators/A&P                          58-year-old male presents for acute on chronic right lower back pain.  He has had concentrated urine with some intermittent mild discomfort but no other acute symptoms.  Vitals are stable, physical exam is unremarkable, he is very well-appearing.  Urinalysis shows no infections or abnormal findings.  BMP shows normal kidney function.  Very low suspicion for renal abnormality, kidney stone or anything else warranting emergent imaging.  Have encouraged patient for hydration and follow-up with primary doctor.  Patient will be discharged and treated as an outpatient.  Discharge plan and strict return to ED precautions discussed, patient verbalizes understanding and agreement. Final Clinical Impression(s) / ED Diagnoses Final diagnoses:  Chronic right-sided low back pain, unspecified whether sciatica present    Rx / DC Orders ED Discharge Orders    None       Rozelle Logan, DO 10/13/20 2336

## 2021-03-08 ENCOUNTER — Emergency Department (HOSPITAL_BASED_OUTPATIENT_CLINIC_OR_DEPARTMENT_OTHER)
Admission: EM | Admit: 2021-03-08 | Discharge: 2021-03-08 | Disposition: A | Discharge status: Home / Self Care | Payer: 59 | Attending: Emergency Medicine | Admitting: Emergency Medicine

## 2021-03-08 ENCOUNTER — Encounter (HOSPITAL_BASED_OUTPATIENT_CLINIC_OR_DEPARTMENT_OTHER): Payer: Self-pay | Admitting: *Deleted

## 2021-03-08 ENCOUNTER — Other Ambulatory Visit: Payer: Self-pay

## 2021-03-08 DIAGNOSIS — R590 Localized enlarged lymph nodes: Secondary | ICD-10-CM | POA: Insufficient documentation

## 2021-03-08 NOTE — ED Provider Notes (Signed)
[facility] EMERGENCY DEPARTMENT Provider Note   CSN: [id number] Arrival date & time: 03/08/21  1500     History Chief Complaint  Patient presents with   Lymphadenopathy    Mike Casey is a 40 y.o. male.  Mike Casey presents with swollen and lymph nodes on his anterior neck.  He awoke today with the swollen nodes but denies any other symptoms.  He is mainly wondering if he is experiencing an allergic reaction to IV contrast dye.  He had a CT scan yesterday to evaluate chronic right-sided pain, and he received IV dye.  He states that the swelling in his lymph nodes has been constant all day today.  No exacerbating or relieving factors.  Pain is mild.  Denies sore throat.  States that he has had some allergies.  Denies fever or weight changes.  The history is provided by the patient.      History reviewed. No pertinent past medical history.  There are no problems to display for this patient.   History reviewed. No pertinent surgical history.     No family history on file.  Social History   Tobacco Use   Smoking status: Never   Smokeless tobacco: Never  Vaping Use   Vaping Use: Never used  Substance Use Topics   Alcohol use: Yes    Comment: occasionally   Drug use: No    Home Medications Prior to Admission medications   Medication Sig Start Date End Date Taking? Authorizing Provider  benzonatate (TESSALON) 100 MG capsule Take 1 capsule (100 mg total) by mouth every 8 (eight) hours. 12/17/18   Petrucelli, Samantha R, PA-C  diclofenac sodium (VOLTAREN) 1 % GEL Apply 4 g 4 (four) times daily topically. 08/19/17   McDonald, Mia A, PA-C  fluticasone (FLONASE) 50 MCG/ACT nasal spray Place 1 spray into both nostrils daily. 12/17/18   Petrucelli, Samantha R, PA-C  ibuprofen (ADVIL,MOTRIN) 600 MG tablet Take 1 tablet (600 mg total) by mouth every 6 (six) hours as needed. 04/18/17   Renne Crigler, PA-C  methocarbamol (ROBAXIN) 500 MG tablet Take 2 tablets  (1,000 mg total) by mouth 4 (four) times daily. 04/18/17   Renne Crigler, PA-C  Multiple Vitamin (MULTIVITAMIN WITH MINERALS) TABS Take 1 tablet by mouth daily.    [provider]  naproxen (NAPROSYN) 500 MG tablet Take 1 tablet (500 mg total) by mouth 2 (two) times daily. 12/17/18   Petrucelli, Pleas Koch, PA-C    Allergies    Patient has no known allergies.  Review of Systems   Review of Systems  Constitutional:  Negative for chills and fever.  HENT:  Negative for ear pain and sore throat.   Eyes:  Negative for pain and visual disturbance.  Respiratory:  Negative for cough and shortness of breath.   Cardiovascular:  Negative for chest pain and palpitations.  Gastrointestinal:  Negative for abdominal pain and vomiting.  Genitourinary:  Negative for dysuria and hematuria.  Musculoskeletal:  Negative for arthralgias and back pain.  Skin:  Negative for color change and rash.  Neurological:  Negative for seizures and syncope.  All other systems reviewed and are negative.  Physical Exam Updated Vital Signs BP 122/83 (BP Location: Right Arm)   Pulse 87   Temp 98.5 F (36.9 C) (Oral)   Resp 18   Ht 6' (1.829 m)   Wt 65.8 kg   SpO2 99%   BMI 19.67 kg/m   Physical Exam Vitals and nursing note reviewed.  Constitutional:  Appearance: Normal appearance.  HENT:     Head: Normocephalic and atraumatic.     Nose: Nose normal.     Mouth/Throat:     Mouth: Mucous membranes are moist.     Pharynx: No oropharyngeal exudate or posterior oropharyngeal erythema.  Eyes:     Conjunctiva/sclera: Conjunctivae normal.  Neck:     Comments: Small, 1 cm in diameter, tender anterior cervical lymph nodes Pulmonary:     Effort: Pulmonary effort is normal. No respiratory distress.  Musculoskeletal:        General: No deformity. Normal range of motion.     Cervical back: Normal range of motion.  Lymphadenopathy:     Cervical: Cervical adenopathy present.  Skin:    General: Skin is  warm and dry.  Neurological:     General: No focal deficit present.     Mental Status: He is alert and oriented to person, place, and time. Mental status is at baseline.  Psychiatric:        Mood and Affect: Mood normal.    ED Results / Procedures / Treatments   Labs (all labs ordered are listed, but only abnormal results are displayed) Labs Reviewed - No data to display  EKG None  Radiology No results found.  Procedures Procedures   Medications Ordered in ED Medications - No data to display  ED Course  I have reviewed the triage vital signs and the nursing notes.  Pertinent labs & imaging results that were available during my care of the patient were reviewed by me and considered in my medical decision making (see chart for details).    MDM Rules/Calculators/A&P                          Ab Bathe presents with cervical lymphadenopathy.  He has no other symptoms to suggest an obvious cause.  I spoke about possibly strep testing, but without a sore throat this would be unlikely.  Lymph nodes could be swollen secondary to his history of allergies.  Symptoms do not seem consistent with malignancy.  They have been there for 24 hours or less and are tender.  I advised conservative management and follow-up should things worsen.  I do not think this is a reaction to the IV dye. Final Clinical Impression(s) / ED Diagnoses Final diagnoses:  Cervical lymphadenopathy    Rx / DC Orders ED Discharge Orders     None        Koleen Distance, MD 03/08/21 [phone]

## 2021-03-08 NOTE — ED Triage Notes (Signed)
He woke with swollen lymph nodes in his neck. He had an MRI of his back yesterday. He is not sure if the dye is the cause of his swelling. Swallowing without difficulty.

## 2021-10-02 ENCOUNTER — Other Ambulatory Visit: Payer: Self-pay

## 2021-10-02 ENCOUNTER — Other Ambulatory Visit (HOSPITAL_BASED_OUTPATIENT_CLINIC_OR_DEPARTMENT_OTHER): Payer: Self-pay

## 2021-10-02 ENCOUNTER — Emergency Department (HOSPITAL_BASED_OUTPATIENT_CLINIC_OR_DEPARTMENT_OTHER)
Admission: EM | Admit: 2021-10-02 | Discharge: 2021-10-02 | Disposition: A | Discharge status: Home / Self Care | Payer: 59 | Attending: Emergency Medicine | Admitting: Emergency Medicine

## 2021-10-02 ENCOUNTER — Encounter (HOSPITAL_BASED_OUTPATIENT_CLINIC_OR_DEPARTMENT_OTHER): Payer: Self-pay

## 2021-10-02 DIAGNOSIS — R059 Cough, unspecified: Secondary | ICD-10-CM | POA: Diagnosis present

## 2021-10-02 DIAGNOSIS — Z20822 Contact with and (suspected) exposure to covid-19: Secondary | ICD-10-CM | POA: Insufficient documentation

## 2021-10-02 DIAGNOSIS — B349 Viral infection, unspecified: Secondary | ICD-10-CM | POA: Insufficient documentation

## 2021-10-02 LAB — RESP PANEL BY RT-PCR (FLU A&B, COVID) ARPGX2
Influenza A by PCR: NEGATIVE
Influenza B by PCR: NEGATIVE
SARS Coronavirus 2 by RT PCR: NEGATIVE

## 2021-10-02 MED ORDER — HYOSCYAMINE SULFATE 0.125 MG SL SUBL: 0.2500 mg | SUBLINGUAL_TABLET | Freq: Once | SUBLINGUAL | Status: DC

## 2021-10-02 MED ORDER — ALUM & MAG HYDROXIDE-SIMETH 200-200-20 MG/5ML PO SUSP
30.0000 mL | Freq: Once | ORAL | Status: AC
  Administered 2021-10-02: 30 mL via ORAL
  Filled 2021-10-02: qty 30

## 2021-10-02 MED ORDER — ONDANSETRON 4 MG PO TBDP
4.0000 mg | ORAL_TABLET | Freq: Once | ORAL | Status: AC
  Administered 2021-10-02: 4 mg via ORAL
  Filled 2021-10-02: qty 1

## 2021-10-02 MED ORDER — BENZONATATE 100 MG PO CAPS
100.0000 mg | ORAL_CAPSULE | Freq: Three times a day (TID) | ORAL | 0 refills | Status: AC
  Filled 2021-10-02: qty 21, 7d supply, fill #0

## 2021-10-02 MED ORDER — ONDANSETRON 4 MG PO TBDP
4.0000 mg | ORAL_TABLET | Freq: Three times a day (TID) | ORAL | 0 refills | Status: AC | PRN
  Filled 2021-10-02: qty 15, 5d supply, fill #0

## 2021-10-02 MED ORDER — BENZONATATE 100 MG PO CAPS
100.0000 mg | ORAL_CAPSULE | Freq: Once | ORAL | Status: AC
  Administered 2021-10-02: 100 mg via ORAL
  Filled 2021-10-02: qty 1

## 2021-10-02 NOTE — ED Provider Notes (Signed)
[facility] EMERGENCY DEPARTMENT Provider Note  CSN: [id number] Arrival date & time: 10/02/21 0554  Chief Complaint(s) Chills, Cough, and Nausea  HPI Mike Casey is a 41 y.o. male    Emesis Severity:  Moderate Duration:  4 hours Timing:  Intermittent Quality:  Stomach contents Progression:  Unchanged Chronicity:  New Recent urination:  Normal Relieved by:  Nothing Worsened by:  Nothing Associated symptoms: chills, cough, diarrhea, sore throat and URI   Associated symptoms: no fever   Risk factors: sick contacts (daughter had URI symptoms last week) and suspect food intake (had some sea food last night)    Past Medical History History reviewed. No pertinent past medical history. There are no problems to display for this patient.  Home Medication(s) Prior to Admission medications   Medication Sig Start Date End Date Taking? Authorizing Provider  benzonatate (TESSALON) 100 MG capsule Take 1 capsule (100 mg total) by mouth every 8 (eight) hours. 10/02/21  Yes Jillayne Witte, Amadeo GarnetPedro Eduardo, MD  ondansetron (ZOFRAN-ODT) 4 MG disintegrating tablet Take 1 tablet (4 mg total) by mouth every 8 (eight) hours as needed for up to 3 days for nausea or vomiting. 10/02/21 10/05/21 Yes Rande Dario, Amadeo GarnetPedro Eduardo, MD  diclofenac sodium (VOLTAREN) 1 % GEL Apply 4 g 4 (four) times daily topically. 08/19/17   McDonald, Mia A, PA-C  fluticasone (FLONASE) 50 MCG/ACT nasal spray Place 1 spray into both nostrils daily. 12/17/18   Petrucelli, Samantha R, PA-C  ibuprofen (ADVIL,MOTRIN) 600 MG tablet Take 1 tablet (600 mg total) by mouth every 6 (six) hours as needed. 04/18/17   Renne CriglerGeiple, Joshua, PA-C  methocarbamol (ROBAXIN) 500 MG tablet Take 2 tablets (1,000 mg total) by mouth 4 (four) times daily. 04/18/17   Renne CriglerGeiple, Joshua, PA-C  Multiple Vitamin (MULTIVITAMIN WITH MINERALS) TABS Take 1 tablet by mouth daily.    [provider]  naproxen (NAPROSYN) 500 MG tablet Take 1 tablet (500 mg total) by mouth  2 (two) times daily. 12/17/18   Petrucelli, Pleas KochSamantha R, PA-C                                                                                                                                    Allergies Patient has no known allergies.  Review of Systems Review of Systems  Constitutional:  Positive for chills. Negative for fever.  HENT:  Positive for sore throat.   Respiratory:  Positive for cough.   Gastrointestinal:  Positive for diarrhea and vomiting.  As noted in HPI  Physical Exam Vital Signs  I have reviewed the triage vital signs BP 116/77    Pulse 98    Temp 97.7 F (36.5 C) (Oral)    Resp 18    Ht 6' (1.829 m)    Wt 68.6 kg    SpO2 97%    BMI 20.51 kg/m   Physical Exam Vitals reviewed.  Constitutional:      General: He is  not in acute distress.    Appearance: He is well-developed. He is not diaphoretic.  HENT:     Head: Normocephalic and atraumatic.     Right Ear: External ear normal.     Left Ear: External ear normal.     Nose: Nose normal.     Mouth/Throat:     Mouth: Mucous membranes are moist.     Comments: Post nasal drip  Eyes:     General: No scleral icterus.    Conjunctiva/sclera: Conjunctivae normal.  Neck:     Trachea: Phonation normal.  Cardiovascular:     Rate and Rhythm: Normal rate and regular rhythm.  Pulmonary:     Effort: Pulmonary effort is normal. No respiratory distress.     Breath sounds: No stridor.  Abdominal:     General: There is no distension.     Tenderness: There is no abdominal tenderness.  Musculoskeletal:        General: Normal range of motion.     Cervical back: Normal range of motion.  Neurological:     Mental Status: He is alert and oriented to person, place, and time.  Psychiatric:        Behavior: Behavior normal.    ED Results and Treatments Labs (all labs ordered are listed, but only abnormal results are displayed) Labs Reviewed  RESP PANEL BY RT-PCR (FLU A&B, COVID) ARPGX2                                                                                                                          EKG  EKG Interpretation  Date/Time:    Ventricular Rate:    PR Interval:    QRS Duration:   QT Interval:    QTC Calculation:   R Axis:     Text Interpretation:         Radiology No results found.  Pertinent labs & imaging results that were available during my care of the patient were reviewed by me and considered in my medical decision making (see MDM for details).  Medications Ordered in ED Medications  hyoscyamine (LEVSIN SL) SL tablet 0.25 mg (0.25 mg Sublingual Not Given 10/02/21 0636)  ondansetron (ZOFRAN-ODT) disintegrating tablet 4 mg (4 mg Oral Given 10/02/21 0632)  alum & mag hydroxide-simeth (MAALOX/MYLANTA) 200-200-20 MG/5ML suspension 30 mL (30 mLs Oral Given 10/02/21 5170)  benzonatate (TESSALON) capsule 100 mg (100 mg Oral Given 10/02/21 0631)  Procedures Procedures  (including critical care time)  Medical Decision Making / ED Course     Patient presents with viral symptoms for 1 week. Adequate oral hydration. Rest of history as above.  Patient appears well. No signs of toxicity, patient is interactive. No hypoxia, tachypnea or other signs of respiratory distress. No sign of clinical dehydration. Lung exam clear. Rest of exam as above.  Most consistent with viral illness.  COVID/influenza ordered and interpreted by me was negative.   No evidence suggestive of pharyngitis, AOM, PNA, or meningitis.  Chest x-ray not indicated at this time.  Patient treated symptomatically and able to tolerate oral intake.  Given his reassuring clinical status, I do not feel that labs or imaging to assess for serious intra-abdominal inflammatory/infectious processes are needed at this time.  Discussed symptomatic treatment with the patient and they will follow closely with  their PCP.     Final Clinical Impression(s) / ED Diagnoses Final diagnoses:  Viral illness   The patient appears reasonably screened and/or stabilized for discharge and I doubt any other medical condition or other [facility] requiring further screening, evaluation, or treatment in the ED at this time prior to discharge. Safe for discharge with strict return precautions.  Disposition: Discharge  Condition: Good  I have discussed the results, Dx and Tx plan with the patient/family who expressed understanding and agree(s) with the plan. Discharge instructions discussed at length. The patient/family was given strict return precautions who verbalized understanding of the instructions. No further questions at time of discharge.    ED Discharge Orders          Ordered    ondansetron (ZOFRAN-ODT) 4 MG disintegrating tablet  Every 8 hours PRN        10/02/21 0731    benzonatate (TESSALON) 100 MG capsule  Every 8 hours        10/02/21 0731             Follow Up: Deretha Emory, FNP [address] [place] [address] [phone]  Call  to schedule an appointment for close follow up, if symptoms do not improve or  worsen           This chart was dictated using voice recognition software.  Despite best efforts to proofread,  errors can occur which can change the documentation meaning.    Nira Conn, MD 10/02/21 [phone]

## 2021-10-02 NOTE — ED Triage Notes (Signed)
Patient complains of cough, congestion, chills and nausea vomiting. Patient had sore throat x1 week which he states now feels better.

## 2022-03-12 ENCOUNTER — Emergency Department (HOSPITAL_BASED_OUTPATIENT_CLINIC_OR_DEPARTMENT_OTHER): Payer: 59 | Admitting: Radiology

## 2022-03-12 ENCOUNTER — Encounter (HOSPITAL_BASED_OUTPATIENT_CLINIC_OR_DEPARTMENT_OTHER): Payer: Self-pay | Admitting: Emergency Medicine

## 2022-03-12 ENCOUNTER — Emergency Department (HOSPITAL_BASED_OUTPATIENT_CLINIC_OR_DEPARTMENT_OTHER)
Admission: EM | Admit: 2022-03-12 | Discharge: 2022-03-12 | Disposition: A | Discharge status: Home / Self Care | Payer: 59 | Attending: Emergency Medicine | Admitting: Emergency Medicine

## 2022-03-12 ENCOUNTER — Other Ambulatory Visit: Payer: Self-pay

## 2022-03-12 DIAGNOSIS — J029 Acute pharyngitis, unspecified: Secondary | ICD-10-CM | POA: Diagnosis present

## 2022-03-12 DIAGNOSIS — J069 Acute upper respiratory infection, unspecified: Secondary | ICD-10-CM | POA: Insufficient documentation

## 2022-03-12 DIAGNOSIS — R000 Tachycardia, unspecified: Secondary | ICD-10-CM | POA: Diagnosis not present

## 2022-03-12 DIAGNOSIS — U071 COVID-19: Secondary | ICD-10-CM | POA: Diagnosis not present

## 2022-03-12 LAB — RESP PANEL BY RT-PCR (FLU A&B, COVID) ARPGX2
Influenza A by PCR: NEGATIVE
Influenza B by PCR: NEGATIVE
SARS Coronavirus 2 by RT PCR: POSITIVE — AB

## 2022-03-12 LAB — GROUP A STREP BY PCR: Group A Strep by PCR: NOT DETECTED

## 2022-03-12 MED ORDER — KETOROLAC TROMETHAMINE 30 MG/ML IJ SOLN
30.0000 mg | Freq: Once | INTRAMUSCULAR | Status: DC
  Filled 2022-03-12: qty 1

## 2022-03-12 MED ORDER — SODIUM CHLORIDE 0.9 % IV BOLUS: 1000.0000 mL | Freq: Once | INTRAVENOUS | Status: DC

## 2022-03-12 MED ORDER — ACETAMINOPHEN 325 MG PO TABS
650.0000 mg | ORAL_TABLET | Freq: Once | ORAL | Status: AC | PRN
  Administered 2022-03-12: 650 mg via ORAL
  Filled 2022-03-12: qty 2

## 2022-03-12 NOTE — ED Provider Notes (Signed)
[facility] EMERGENCY DEPT Provider Note   CSN: [id number] Arrival date & time: 03/12/22  0813     History  Chief Complaint  Patient presents with   Generalized Body Aches    Mike Casey is a 41 y.o. male healthy male who presents with sinus congestion, body aches, headache, sore throat. He reports noticing sore throat on Wednesday of last week. He went to work that day and started to feel unwell towards the end of the day. That evening he began having body aches, sinus congestion, headache, and then noted cough and chest congestion. He notes SOB on exertion and fatigue. He has had poor appetite and little po intake. He reports left sided jaw and ear pain from his sinus headache. He is day 6 of symptoms and has not yet been tested for COVID or flu. He has had COVID once before and lost his taste that time, no loss of taste currently. He received both COVID vaccinations but no boosters. Does not recall if he received flu vaccination this year.   He has been using Theraflu, Excedrin migraine, nasal spray, mucinex, tea, halls lozenges, robitussin, for his symptoms and reports persistence of symptoms. His nasal congestion is the most bothersome, makes it difficult for him to sleep at night.       Home Medications Prior to Admission medications   Medication Sig Start Date End Date Taking? Authorizing Provider  benzonatate (TESSALON) 100 MG capsule Take 1 capsule (100 mg total) by mouth every 8 (eight) hours. 10/02/21   Cardama, Amadeo Garnet, MD  diclofenac sodium (VOLTAREN) 1 % GEL Apply 4 g 4 (four) times daily topically. 08/19/17   McDonald, Mia A, PA-C  fluticasone (FLONASE) 50 MCG/ACT nasal spray Place 1 spray into both nostrils daily. 12/17/18   Petrucelli, Samantha R, PA-C  ibuprofen (ADVIL,MOTRIN) 600 MG tablet Take 1 tablet (600 mg total) by mouth every 6 (six) hours as needed. 04/18/17   Renne Crigler, PA-C  methocarbamol (ROBAXIN) 500 MG tablet Take 2 tablets (1,000  mg total) by mouth 4 (four) times daily. 04/18/17   Renne Crigler, PA-C  Multiple Vitamin (MULTIVITAMIN WITH MINERALS) TABS Take 1 tablet by mouth daily.    [provider]  naproxen (NAPROSYN) 500 MG tablet Take 1 tablet (500 mg total) by mouth 2 (two) times daily. 12/17/18   Petrucelli, Pleas Koch, PA-C      Allergies    Patient has no known allergies.    Review of Systems   Review of Systems  Constitutional:  Positive for appetite change, chills and fatigue. Negative for fever.  HENT:  Positive for congestion, ear pain, postnasal drip, rhinorrhea, sinus pressure and sore throat. Negative for drooling and trouble swallowing.   Eyes:  Positive for discharge. Negative for visual disturbance.  Respiratory:  Positive for cough and shortness of breath.   Cardiovascular:  Negative for chest pain and leg swelling.  Gastrointestinal:  Negative for abdominal pain, nausea and vomiting.  Musculoskeletal:  Positive for arthralgias and myalgias.  Neurological:  Positive for headaches.    Physical Exam Updated Vital Signs BP 111/89 (BP Location: Right Arm)   Pulse 96   Temp 98.7 F (37.1 C) (Oral)   Resp 17   Ht 6' (1.829 m)   Wt 63.5 kg   SpO2 100%   BMI 18.99 kg/m  Physical Exam Constitutional:      General: He is not in acute distress.    Appearance: He is normal weight. He is not toxic-appearing.  HENT:     Head: Normocephalic and atraumatic.  Eyes:     General: No scleral icterus.    Conjunctiva/sclera: Conjunctivae normal.  Cardiovascular:     Rate and Rhythm: Regular rhythm. Tachycardia present.     Heart sounds: Normal heart sounds.  Pulmonary:     Effort: Pulmonary effort is normal.     Breath sounds: Normal breath sounds.  Abdominal:     General: Abdomen is flat. There is no distension.     Tenderness: There is no abdominal tenderness.  Musculoskeletal:     Right lower leg: No edema.     Left lower leg: No edema.  Skin:    General: Skin is warm and dry.      Findings: No rash.  Neurological:     Mental Status: He is alert and oriented to person, place, and time. Mental status is at baseline.  Psychiatric:        Mood and Affect: Mood normal.        Behavior: Behavior normal.     ED Results / Procedures / Treatments   Labs (all labs ordered are listed, but only abnormal results are displayed) Labs Reviewed  RESP PANEL BY RT-PCR (FLU A&B, COVID) ARPGX2 - Abnormal; Notable for the following components:      Result Value   SARS Coronavirus 2 by RT PCR POSITIVE (*)    All other components within normal limits  GROUP A STREP BY PCR    EKG None  Radiology DG Chest 2 View  Result Date: 03/12/2022 CLINICAL DATA:  Fever. Chest and nasal congestion. Fever and body aches. EXAM: CHEST - 2 VIEW COMPARISON:  07/08/2015 FINDINGS: Heart size is normal. Mediastinal shadows are normal. The lungs are clear. No bronchial thickening. No infiltrate, mass, effusion or collapse. Pulmonary vascularity is normal. No bony abnormality. IMPRESSION: Normal chest Electronically Signed   By: Paulina FusiMark  Shogry M.D.   On: 03/12/2022 09:01     Medications Ordered in ED Medications  sodium chloride 0.9 % bolus 1,000 mL (1,000 mLs Intravenous Not Given 03/12/22 1034)  ketorolac (TORADOL) 30 MG/ML injection 30 mg (30 mg Intravenous Not Given 03/12/22 1034)  acetaminophen (TYLENOL) tablet 650 mg (650 mg Oral Given 03/12/22 0841)    ED Course/ Medical Decision Making/ A&P                           Medical Decision Making Amount and/or Complexity of Data Reviewed Radiology: ordered.  Risk OTC drugs. Prescription drug management.  Mike Casey is a 41 y.o. male healthy male who presents with 6d of flu like symptoms. He arrived with low grade fever 100.35F and tachycardia 118, though normotensive and otherwise hemodynamically stable, satting well on room air. He tested COVID positive here in the ED. He is outside the window for Paxlovid and remdesivir. Patient does not  appear septic.  NS bolus and Toradol ordered for dehydration and sinus headache though patient reports he is uncomfortable with needles and declines IV.  We discussed OTC medications for his sinus congestion, headache, chest congestion, cough. Encouraged PO intake as tolerated. He is medically safe for discharge. Discussed return precautions.  Provided printout for managing COVID-19 symptoms at home.  Final Clinical Impression(s) / ED Diagnoses Final diagnoses:  Upper respiratory tract infection due to COVID-19 virus    Rx / DC Orders ED Discharge Orders     None         Ellison CarwinZinoviev, Beola Vasallo, MD  03/12/22 1249    Franne Forts, DO 03/13/22 1918

## 2022-03-12 NOTE — Discharge Instructions (Addendum)
You were evaluated at [facility] ED for COVID symptoms. You tested COVID-19 positive on 03/12/22.  Continue taking over the counter medications for your symptoms. I have also attached a guide for how to deal with certain COVID symptoms that you may find helpful.   If you start to have fevers, worsening cough, worsening SOB, SOB at rest, please return to the ED for re-evaluation.   Please establish care with a new PCP. We recommend you follow up with your PCP 1-2 weeks after being seen in the ED to ensure symptoms are managed and resolving.   Thank you for allowing [place] to be part of your care.

## 2022-03-12 NOTE — ED Triage Notes (Signed)
Pt arrives to ED with c/o body aches, fever, headache, chills, congestion, sore throat x4 days.

## 2023-09-16 IMAGING — DX DG CHEST 2V
2 series · 2 of 2 positions shown · non-contrast
Comparison: 07/08/2015

CLINICAL DATA: Fever. Chest and nasal congestion. Fever and body
aches.

EXAM:
CHEST - 2 VIEW

[chest pa]
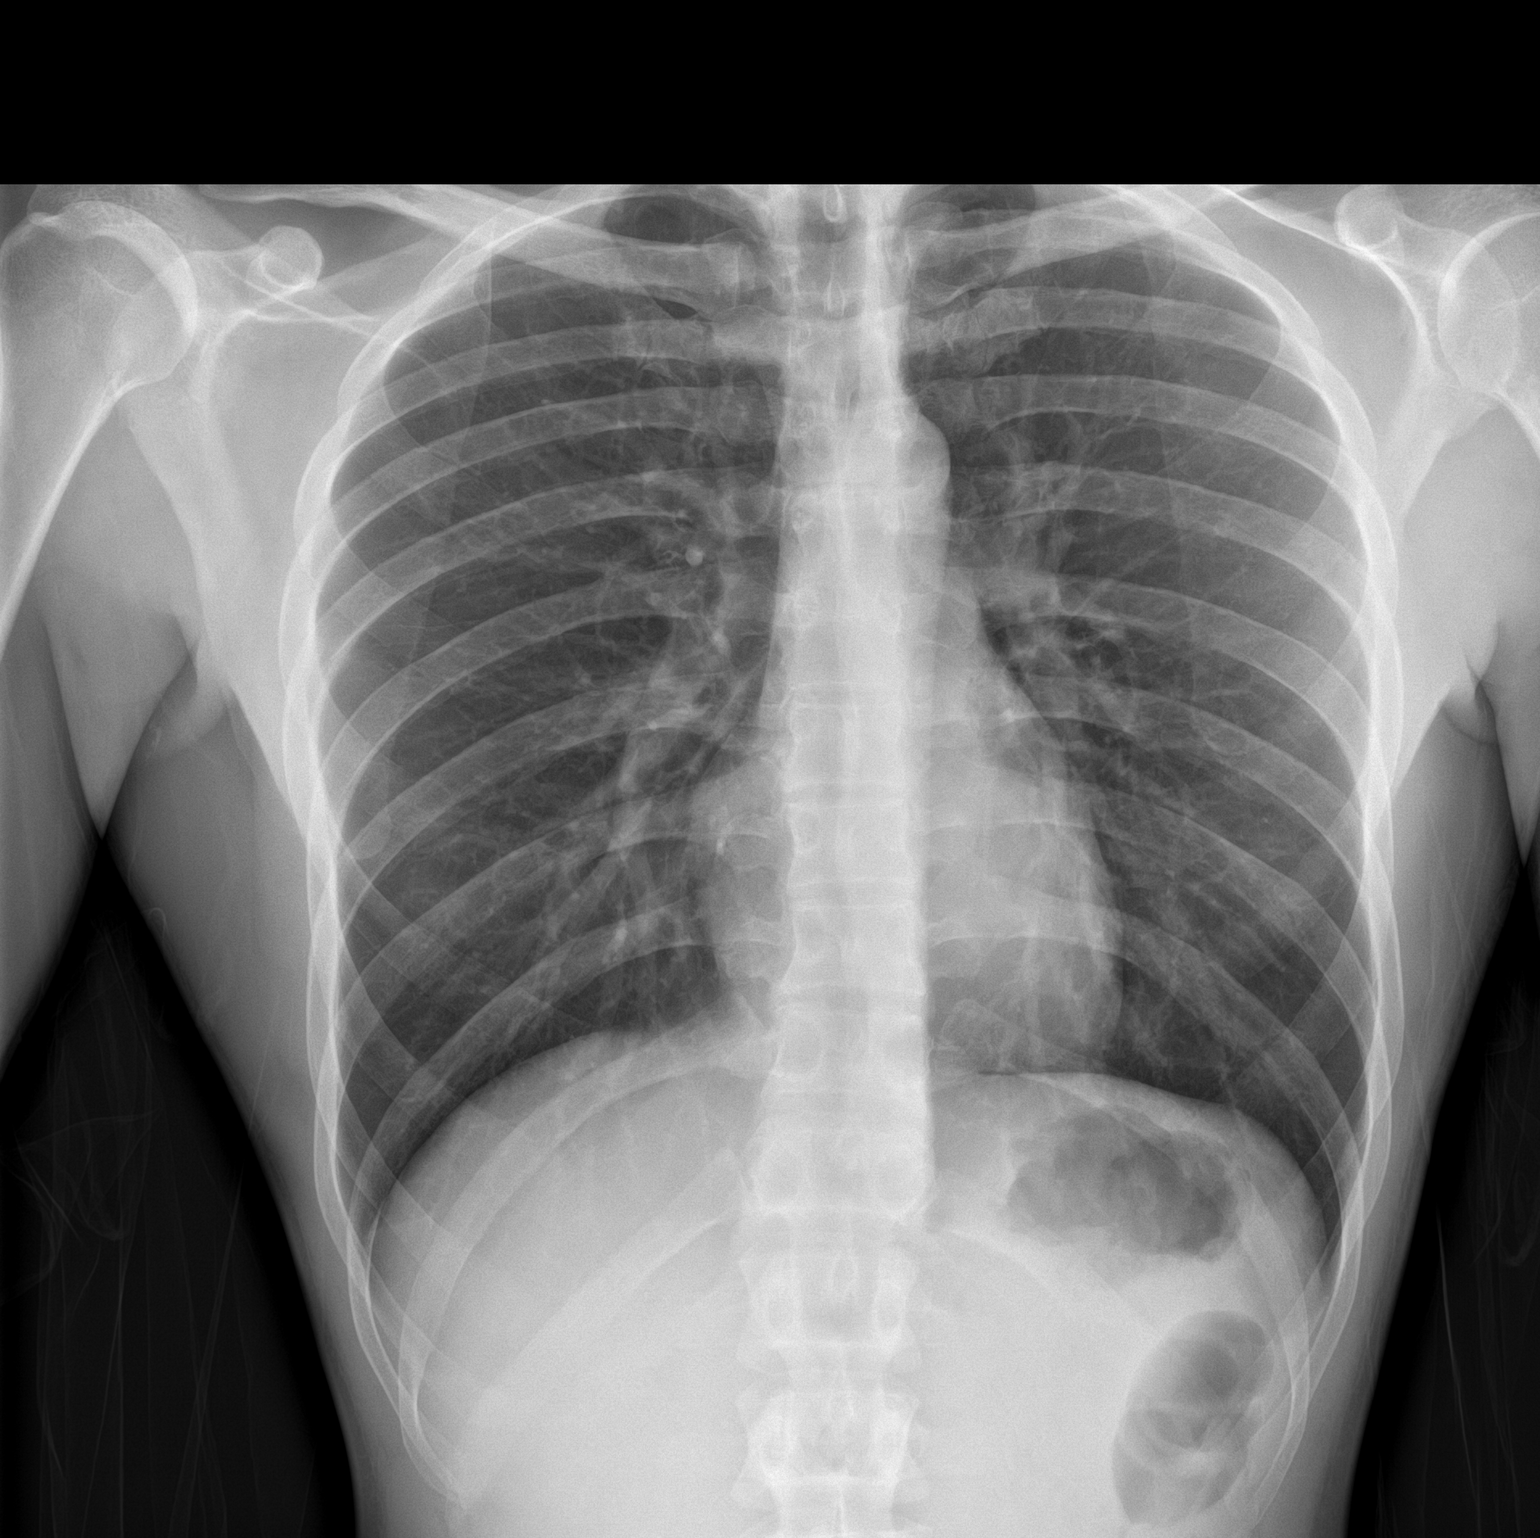

[chest lat]
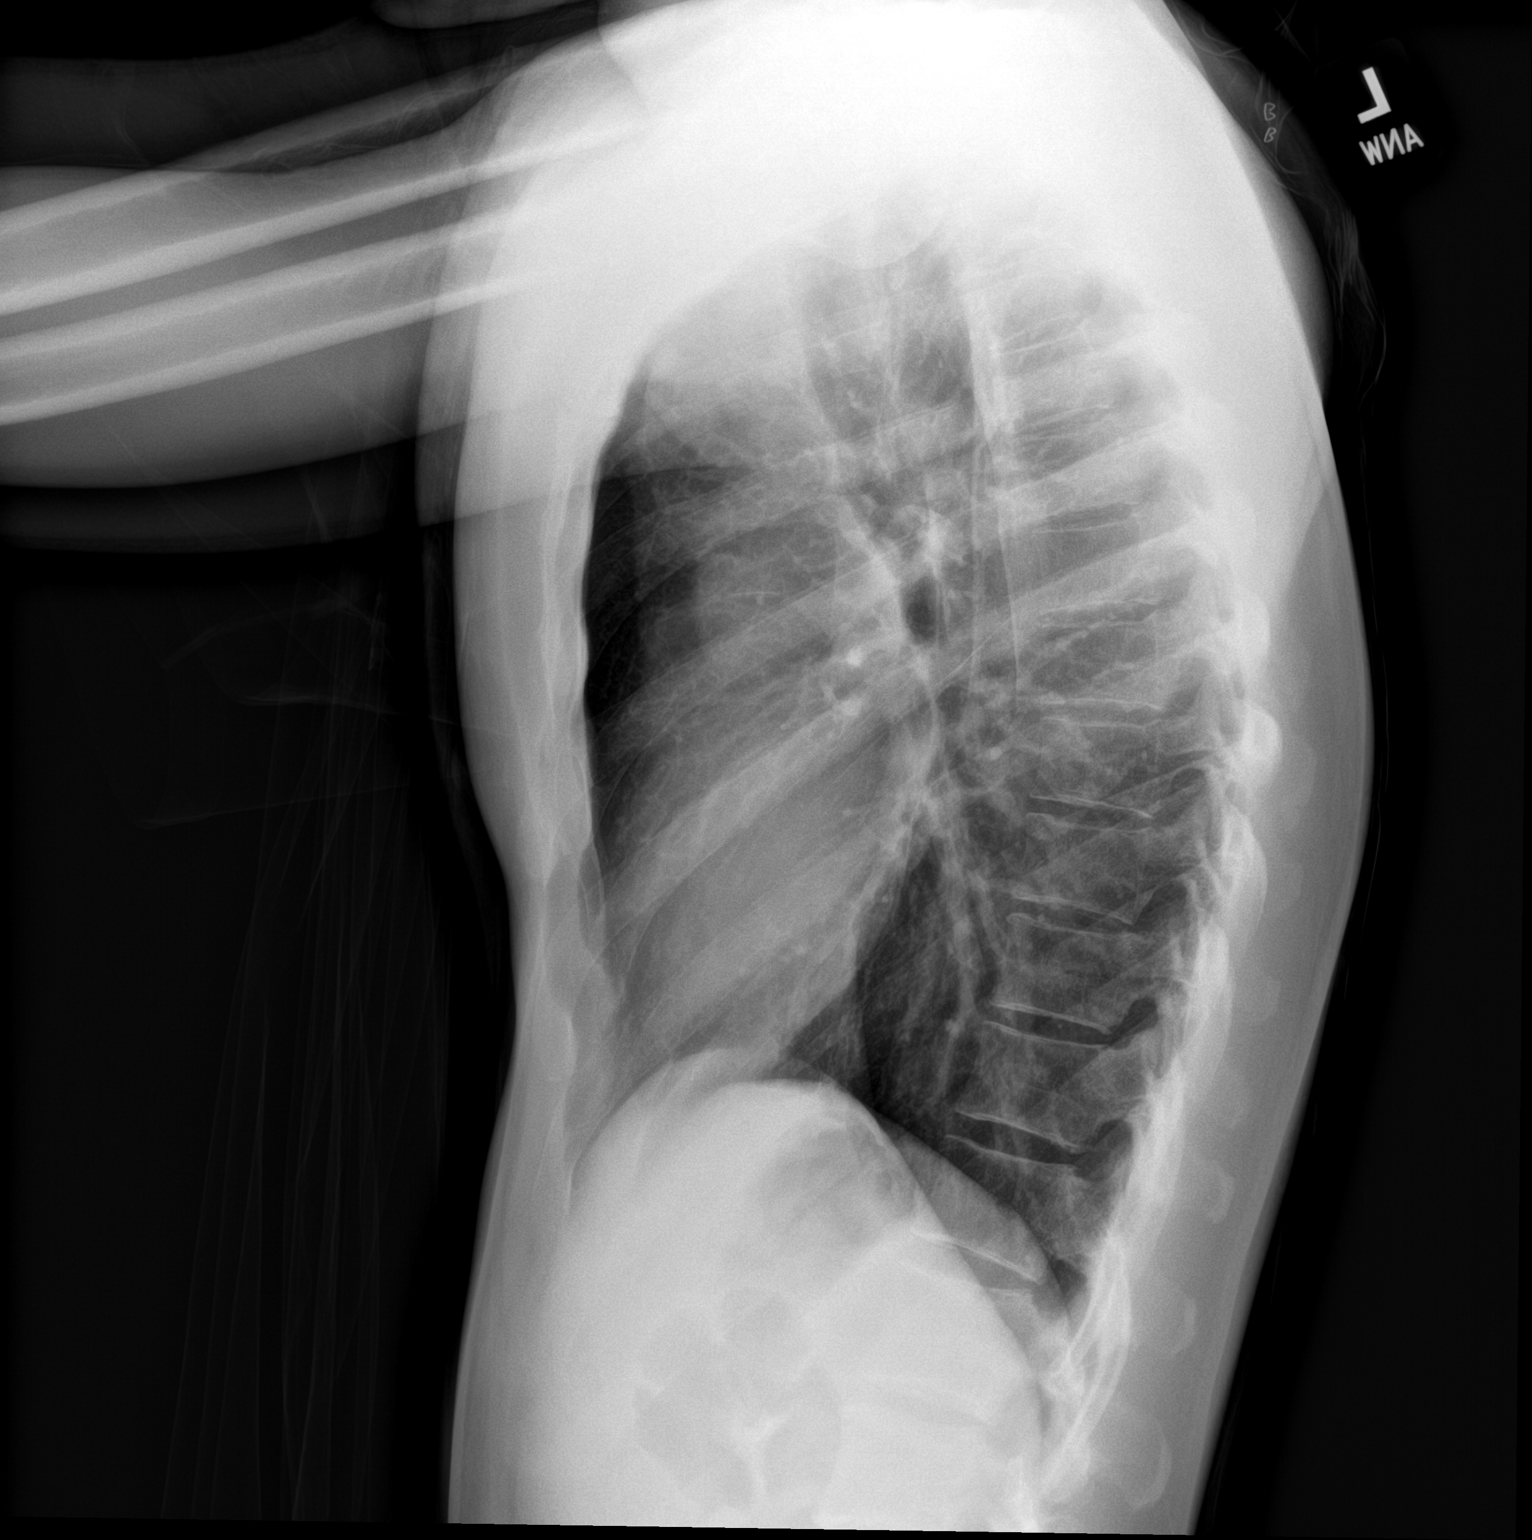

[2 of 2 positions shown; findings below may reference images not displayed]

FINDINGS: Heart size is normal. Mediastinal shadows are normal. The lungs are
clear. No bronchial thickening. No infiltrate, mass, effusion or
collapse. Pulmonary vascularity is normal. No bony abnormality.
IMPRESSION: Normal chest
